# Patient Record
Sex: Female | Born: 1990
Health system: Southern US, Community
[De-identification: ages and names within clinical notes are randomized; demographics above are authoritative.]

## PROBLEM LIST (undated history)

## (undated) DIAGNOSIS — J309 Allergic rhinitis, unspecified: Secondary | ICD-10-CM

## (undated) DIAGNOSIS — N809 Endometriosis, unspecified: Secondary | ICD-10-CM

## (undated) DIAGNOSIS — D509 Iron deficiency anemia, unspecified: Secondary | ICD-10-CM

## (undated) DIAGNOSIS — D649 Anemia, unspecified: Secondary | ICD-10-CM

## (undated) DIAGNOSIS — Z9884 Bariatric surgery status: Secondary | ICD-10-CM

## (undated) DIAGNOSIS — Z8619 Personal history of other infectious and parasitic diseases: Secondary | ICD-10-CM

## (undated) DIAGNOSIS — R102 Pelvic and perineal pain: Secondary | ICD-10-CM

## (undated) DIAGNOSIS — E282 Polycystic ovarian syndrome: Secondary | ICD-10-CM

## (undated) DIAGNOSIS — E669 Obesity, unspecified: Secondary | ICD-10-CM

## (undated) DIAGNOSIS — Z973 Presence of spectacles and contact lenses: Secondary | ICD-10-CM

## (undated) DIAGNOSIS — Z87820 Personal history of traumatic brain injury: Secondary | ICD-10-CM

## (undated) HISTORY — DX: Anemia, unspecified: D64.9

## (undated) HISTORY — PX: LAPAROSCOPIC GASTRIC SLEEVE RESECTION: SHX5895

## (undated) HISTORY — PX: ESOPHAGOGASTRODUODENOSCOPY (EGD) WITH PROPOFOL: SHX5813

## (undated) HISTORY — DX: Obesity, unspecified: E66.9

## (undated) HISTORY — PX: WISDOM TOOTH EXTRACTION: SHX21

---

## 2013-08-05 ENCOUNTER — Encounter (HOSPITAL_BASED_OUTPATIENT_CLINIC_OR_DEPARTMENT_OTHER): Payer: Self-pay

## 2013-08-05 ENCOUNTER — Emergency Department (HOSPITAL_BASED_OUTPATIENT_CLINIC_OR_DEPARTMENT_OTHER)
Admission: EM | Admit: 2013-08-05 | Discharge: 2013-08-05 | Disposition: A | Discharge status: Home / Self Care | Payer: Self-pay | Attending: Emergency Medicine | Admitting: Emergency Medicine

## 2013-08-05 ENCOUNTER — Emergency Department (HOSPITAL_BASED_OUTPATIENT_CLINIC_OR_DEPARTMENT_OTHER): Payer: Self-pay

## 2013-08-05 DIAGNOSIS — R1013 Epigastric pain: Secondary | ICD-10-CM | POA: Insufficient documentation

## 2013-08-05 DIAGNOSIS — Z3202 Encounter for pregnancy test, result negative: Secondary | ICD-10-CM | POA: Insufficient documentation

## 2013-08-05 DIAGNOSIS — R1011 Right upper quadrant pain: Secondary | ICD-10-CM | POA: Insufficient documentation

## 2013-08-05 DIAGNOSIS — R112 Nausea with vomiting, unspecified: Secondary | ICD-10-CM | POA: Insufficient documentation

## 2013-08-05 DIAGNOSIS — Z8639 Personal history of other endocrine, nutritional and metabolic disease: Secondary | ICD-10-CM | POA: Insufficient documentation

## 2013-08-05 DIAGNOSIS — Z79899 Other long term (current) drug therapy: Secondary | ICD-10-CM | POA: Insufficient documentation

## 2013-08-05 DIAGNOSIS — R109 Unspecified abdominal pain: Secondary | ICD-10-CM

## 2013-08-05 DIAGNOSIS — Z862 Personal history of diseases of the blood and blood-forming organs and certain disorders involving the immune mechanism: Secondary | ICD-10-CM | POA: Insufficient documentation

## 2013-08-05 HISTORY — DX: Polycystic ovarian syndrome: E28.2

## 2013-08-05 LAB — COMPREHENSIVE METABOLIC PANEL
BUN: 13 mg/dL (ref 6–23)
Calcium: 10.2 mg/dL (ref 8.4–10.5)
GFR calc Af Amer: >90 mL/min (ref >90)
Glucose, Bld: 96 mg/dL (ref 70–99)
Total Protein: 7.8 g/dL (ref 6.0–8.3)

## 2013-08-05 LAB — CBC
HCT: 39.4 % (ref 36.0–46.0)
Hemoglobin: 13 g/dL (ref 12.0–15.0)
MCH: 26.8 pg (ref 26.0–34.0)
MCHC: 33 g/dL (ref 30.0–36.0)
RDW: 14.1 % (ref 11.5–15.5)

## 2013-08-05 LAB — URINALYSIS, ROUTINE W REFLEX MICROSCOPIC
Bilirubin Urine: NEGATIVE
Glucose, UA: NEGATIVE mg/dL
Ketones, ur: NEGATIVE mg/dL
Nitrite: POSITIVE — AB
Protein, ur: NEGATIVE mg/dL
pH: 7 (ref 5.0–8.0)

## 2013-08-05 LAB — URINE MICROSCOPIC-ADD ON

## 2013-08-05 LAB — PREGNANCY, URINE: Preg Test, Ur: NEGATIVE

## 2013-08-05 LAB — LIPASE, BLOOD: Lipase: 24 U/L (ref 11–59)

## 2013-08-05 MED ORDER — CEPHALEXIN 500 MG PO CAPS: 500.0000 mg | ORAL_CAPSULE | Freq: Four times a day (QID) | ORAL | Status: DC

## 2013-08-05 MED ORDER — ONDANSETRON HCL 4 MG/2ML IJ SOLN
4.0000 mg | Freq: Once | INTRAMUSCULAR | Status: AC
  Administered 2013-08-05: 4 mg via INTRAVENOUS
  Filled 2013-08-05: qty 8

## 2013-08-05 MED ORDER — ONDANSETRON HCL 4 MG/2ML IJ SOLN
4.0000 mg | Freq: Once | INTRAMUSCULAR | Status: AC
  Administered 2013-08-05: 4 mg via INTRAVENOUS
  Filled 2013-08-05: qty 2

## 2013-08-05 MED ORDER — DEXTROSE 5 % IV SOLN
1.0000 g | Freq: Once | INTRAVENOUS | Status: AC
  Administered 2013-08-05: 1 g via INTRAVENOUS
  Filled 2013-08-05: qty 10

## 2013-08-05 MED ORDER — FAMOTIDINE 20 MG PO TABS
20.0000 mg | ORAL_TABLET | Freq: Once | ORAL | Status: AC
  Administered 2013-08-05: 20 mg via ORAL
  Filled 2013-08-05: qty 1

## 2013-08-05 MED ORDER — IOHEXOL 300 MG/ML  SOLN
100.0000 mL | Freq: Once | INTRAMUSCULAR | Status: AC | PRN
  Administered 2013-08-05: 100 mL via INTRAVENOUS

## 2013-08-05 MED ORDER — HYDROCODONE-ACETAMINOPHEN 5-325 MG PO TABS: 1.0000 | ORAL_TABLET | ORAL | Status: DC | PRN

## 2013-08-05 MED ORDER — PROMETHAZINE HCL 25 MG PO TABS: 25.0000 mg | ORAL_TABLET | Freq: Four times a day (QID) | ORAL | Status: DC | PRN

## 2013-08-05 MED ORDER — IOHEXOL 300 MG/ML  SOLN
50.0000 mL | Freq: Once | INTRAMUSCULAR | Status: AC | PRN
  Administered 2013-08-05: 50 mL via ORAL

## 2013-08-05 MED ORDER — OMEPRAZOLE 20 MG PO CPDR: 20.0000 mg | DELAYED_RELEASE_CAPSULE | Freq: Every day | ORAL | Status: DC

## 2013-08-05 NOTE — ED Notes (Signed)
Pt started having abd pain x2days in upper rt quadrant. Reports h/o PCOS and thought symptoms were related until she vomited at 3pm today (9/5). Rates pain 9/10.

## 2013-08-05 NOTE — ED Notes (Signed)
Return from CT

## 2013-08-05 NOTE — ED Provider Notes (Signed)
CSN: [id number]     Arrival date & time 08/05/13  1819 History   First MD Initiated Contact with Patient 08/05/13 1833     Chief Complaint  Patient presents with  . Abdominal Pain  . Nausea   (Consider location/radiation/quality/duration/timing/severity/associated sxs/prior Treatment) The history is provided by the patient and medical records. No language interpreter was used.    Donna Marquez is a 22 y.o. female  with a hx of PCOS presents to the Emergency Department complaining of gradual, intermittent, progressively worsening epigastric and RUQ abd pain onset 2 days ago.  Pt states she has had pain like this before and it was always attributed to her PCOS but that pain was usually lower.  Pt had associated emesis approx 20 minutes after eating.  She had an increase in her pain at that time as well. Pain always occurs after eating and nothing makes it better including a heating pad.  Pt states pain resolves spontaneously several hours after eating.  Pt denies fever, chills, headache, neck pain, chest pain, SOB, diarrhea, weakness, dizziness, syncope, dysuria, hematuria.  Emesis is NBNB.     Past Medical History  Diagnosis Date  . PCOS (polycystic ovarian syndrome)    Past Surgical History  Procedure Laterality Date  . Wisdom tooth extraction     History reviewed. No pertinent family history. History  Substance Use Topics  . Smoking status: Never Smoker   . Smokeless tobacco: Never Used  . Alcohol Use: 1.2 oz/week    1 Shots of liquor, 1 Cans of beer per week     Comment: Per month   OB History   Grav Para Term Preterm Abortions TAB SAB Ect Mult Living                 Review of Systems  Constitutional: Negative for fever, diaphoresis, appetite change, fatigue and unexpected weight change.  HENT: Negative for mouth sores, trouble swallowing, neck pain and neck stiffness.   Respiratory: Negative for cough, chest tightness, shortness of breath, wheezing and stridor.     Cardiovascular: Negative for chest pain and palpitations.  Gastrointestinal: Positive for nausea, vomiting and abdominal pain. Negative for diarrhea, constipation, blood in stool, abdominal distention and rectal pain.  Genitourinary: Negative for dysuria, urgency, frequency, hematuria, flank pain and difficulty urinating.  Musculoskeletal: Negative for back pain.  Skin: Negative for rash.  Neurological: Negative for weakness.  Hematological: Negative for adenopathy.  Psychiatric/Behavioral: Negative for confusion.  All other systems reviewed and are negative.    Allergies  Review of patient's allergies indicates not on file.  Home Medications   Current Outpatient Rx  Name  Route  Sig  Dispense  Refill  . cephALEXin (KEFLEX) 500 MG capsule   Oral   Take 1 capsule (500 mg total) by mouth 4 (four) times daily.   40 capsule   0   . HYDROcodone-acetaminophen (NORCO/VICODIN) 5-325 MG per tablet   Oral   Take 1 tablet by mouth every 4 (four) hours as needed for pain.   6 tablet   0   . omeprazole (PRILOSEC) 20 MG capsule   Oral   Take 1 capsule (20 mg total) by mouth daily.   30 capsule   0   . promethazine (PHENERGAN) 25 MG tablet   Oral   Take 1 tablet (25 mg total) by mouth every 6 (six) hours as needed for nausea.   12 tablet   0    BP 110/67  Pulse 70  Temp(Src)  98.2 F (36.8 C) (Oral)  Resp 16  Ht 5\' 4"  (1.626 m)  Wt 250 lb (113.399 kg)  BMI 42.89 kg/m2  SpO2 98%  LMP 03/28/2013 Physical Exam  Nursing note and vitals reviewed. Constitutional: She is oriented to person, place, and time. She appears well-developed and well-nourished.  HENT:  Head: Normocephalic and atraumatic.  Mouth/Throat: Oropharynx is clear and moist.  Eyes: Conjunctivae are normal. No scleral icterus.  Neck: Normal range of motion.  Cardiovascular: Normal rate, regular rhythm, S1 normal, S2 normal, normal heart sounds and intact distal pulses.   No murmur heard. Pulses:      Radial  pulses are 2+ on the right side, and 2+ on the left side.       Dorsalis pedis pulses are 2+ on the right side, and 2+ on the left side.       Posterior tibial pulses are 2+ on the right side, and 2+ on the left side.  Pulmonary/Chest: Effort normal and breath sounds normal. She has no decreased breath sounds. She has no wheezes. She has no rhonchi. She has no rales. She exhibits no tenderness and no bony tenderness.  Abdominal: Soft. Normal appearance and bowel sounds are normal. She exhibits no distension and no mass. There is tenderness in the right upper quadrant and epigastric area. There is guarding. There is no rebound and no CVA tenderness.    Musculoskeletal: Normal range of motion. She exhibits no tenderness.  Lymphadenopathy:    She has no cervical adenopathy.  Neurological: She is alert and oriented to person, place, and time. She exhibits normal muscle tone. Coordination normal.  Skin: Skin is warm and dry. No erythema.  Psychiatric: She has a normal mood and affect. Her behavior is normal.    ED Course  Procedures (including critical care time) Labs Review Labs Reviewed  URINALYSIS, ROUTINE W REFLEX MICROSCOPIC - Abnormal; Notable for the following:    APPearance CLOUDY (*)    Nitrite POSITIVE (*)    Leukocytes, UA SMALL (*)    All other components within normal limits  URINE MICROSCOPIC-ADD ON - Abnormal; Notable for the following:    Squamous Epithelial / LPF FEW (*)    Bacteria, UA MANY (*)    All other components within normal limits  URINE CULTURE  PREGNANCY, URINE  CBC  COMPREHENSIVE METABOLIC PANEL  LIPASE, BLOOD   Imaging Review Ct Abdomen Pelvis W Contrast  08/05/2013   *RADIOLOGY REPORT*  Clinical Data: Right upper quadrant pain.  Polycystic ovary disease.  Vomiting  CT ABDOMEN AND PELVIS WITH CONTRAST  Technique:  Multidetector CT imaging of the abdomen and pelvis was performed following the standard protocol during bolus administration of intravenous  contrast.  Contrast: OMNIPAQUE IOHEXOL 300 MG/ML  SOLN, 50mL OMNIPAQUE IOHEXOL 300 MG/ML  SOLN  Comparison: None  Findings: Lung bases are clear.  No pericardial fluid.  No focal hepatic lesion.  The gallbladder, pancreas, spleen, adrenal glands, and kidneys are normal.  The stomach, small bowel, appendix,  and cecum are normal.   The colon and rectosigmoid colon are normal.  Abdominal aorta normal caliber.  No retroperitoneal periportal lymphadenopathy.  No free fluid the pelvis.  The ovaries and uterus appear normal. No pelvic lymphadenopathy. Review of  bone windows demonstrates no aggressive osseous lesions.  IMPRESSION: No acute abdominal or pelvic findings.   Original Report Authenticated By: Genevive Bi, M.D.    MDM   1. Abdominal pain   2. Nausea and vomiting  Donna Marquez presents with RUQ post prandial abd pain, nausea and vomiting.  Suspicion of gallbladder vs PUD.  Will obtain labs and imaging, will also give fluids and nausea control.    Patient is nontoxic, nonseptic appearing, in no apparent distress.  Patient's pain and other symptoms adequately managed in emergency department.  Fluid bolus given.  Labs, imaging and vitals reviewed.  Patient does not meet the SIRS or Sepsis criteria.  On repeat exam patient does not have a surgical abdomin and there are no peritoneal signs.  No indication of appendicitis, bowel obstruction, bowel perforation, cholecystitis, diverticulitis, PID or ectopic pregnancy.  Pt with urinary tract infection on urinalysis. Patient given Rocephin here in the emergency department. Patient tolerating by mouth here.  Will give Keflex, Phenergan for home. Patient also given PPI for treatment of possible peptic ulcer disease. Recommend GI followup if upper abdominal pain persists. Patient discharged home with symptomatic treatment and given strict instructions for follow-up with their primary care physician.  I have also discussed reasons to return  immediately to the ER.  Patient expresses understanding and agrees with plan.        Dahlia Client Brayen Bunn, PA-C 08/05/13 2236  Dierdre Forth, PA-C 08/05/13 2236

## 2013-08-06 NOTE — ED Provider Notes (Signed)
Medical screening examination/treatment/procedure(s) were performed by non-physician practitioner and as supervising physician I was immediately available for consultation/collaboration.    Celene Kras, MD 08/06/13 [phone]

## 2013-08-08 LAB — URINE CULTURE

## 2013-12-09 DIAGNOSIS — E282 Polycystic ovarian syndrome: Secondary | ICD-10-CM | POA: Insufficient documentation

## 2014-07-24 ENCOUNTER — Encounter (HOSPITAL_BASED_OUTPATIENT_CLINIC_OR_DEPARTMENT_OTHER): Payer: Self-pay | Admitting: Emergency Medicine

## 2014-07-24 ENCOUNTER — Emergency Department (HOSPITAL_BASED_OUTPATIENT_CLINIC_OR_DEPARTMENT_OTHER)
Admission: EM | Admit: 2014-07-24 | Discharge: 2014-07-24 | Disposition: A | Discharge status: Home / Self Care | Payer: Self-pay | Attending: Emergency Medicine | Admitting: Emergency Medicine

## 2014-07-24 DIAGNOSIS — J029 Acute pharyngitis, unspecified: Secondary | ICD-10-CM | POA: Insufficient documentation

## 2014-07-24 DIAGNOSIS — Z79899 Other long term (current) drug therapy: Secondary | ICD-10-CM | POA: Insufficient documentation

## 2014-07-24 DIAGNOSIS — Z792 Long term (current) use of antibiotics: Secondary | ICD-10-CM | POA: Insufficient documentation

## 2014-07-24 DIAGNOSIS — J039 Acute tonsillitis, unspecified: Secondary | ICD-10-CM | POA: Insufficient documentation

## 2014-07-24 DIAGNOSIS — Z8639 Personal history of other endocrine, nutritional and metabolic disease: Secondary | ICD-10-CM | POA: Insufficient documentation

## 2014-07-24 DIAGNOSIS — Z862 Personal history of diseases of the blood and blood-forming organs and certain disorders involving the immune mechanism: Secondary | ICD-10-CM | POA: Insufficient documentation

## 2014-07-24 DIAGNOSIS — Z8619 Personal history of other infectious and parasitic diseases: Secondary | ICD-10-CM | POA: Insufficient documentation

## 2014-07-24 LAB — RAPID STREP SCREEN (MED CTR MEBANE ONLY): Streptococcus, Group A Screen (Direct): NEGATIVE

## 2014-07-24 MED ORDER — DIPHENHYDRAMINE HCL 50 MG/ML IJ SOLN
50.0000 mg | Freq: Once | INTRAMUSCULAR | Status: AC
  Administered 2014-07-24: 50 mg via INTRAMUSCULAR
  Filled 2014-07-24: qty 1

## 2014-07-24 MED ORDER — AMOXICILLIN 500 MG PO CAPS: 500.0000 mg | ORAL_CAPSULE | Freq: Three times a day (TID) | ORAL | Status: AC

## 2014-07-24 NOTE — ED Provider Notes (Signed)
CSN: [id number]     Arrival date & time 07/24/14  1924 History   First MD Initiated Contact with Patient 07/24/14 2009     Chief Complaint  Patient presents with  . Sore Throat     (Consider location/radiation/quality/duration/timing/severity/associated sxs/prior Treatment) Patient is a 23 y.o. female presenting with pharyngitis. The history is provided by the patient. No language interpreter was used.  Sore Throat This is a new problem. The current episode started today. The problem occurs constantly. The problem has been gradually worsening. Associated symptoms include a sore throat. Nothing aggravates the symptoms. She has tried nothing for the symptoms. The treatment provided moderate relief.  Pt reports swelling to right tonsil for 3 weeks.  Pt reports today her tongue feels swollen.  Past Medical History  Diagnosis Date  . PCOS (polycystic ovarian syndrome)   . Mononucleosis    Past Surgical History  Procedure Laterality Date  . Wisdom tooth extraction     No family history on file. History  Substance Use Topics  . Smoking status: Never Smoker   . Smokeless tobacco: Never Used  . Alcohol Use: Yes   OB History   Grav Para Term Preterm Abortions TAB SAB Ect Mult Living                 Review of Systems  HENT: Positive for sore throat.   All other systems reviewed and are negative.     Allergies  Ciprofloxacin  Home Medications   Prior to Admission medications   Medication Sig Start Date End Date Taking? Authorizing Provider  amoxicillin (AMOXIL) 500 MG capsule Take 1 capsule (500 mg total) by mouth 3 (three) times daily. 07/24/14 08/03/14  Elson Areas, PA-C  cephALEXin (KEFLEX) 500 MG capsule Take 1 capsule (500 mg total) by mouth 4 (four) times daily. 08/05/13   Hannah Muthersbaugh, PA-C  HYDROcodone-acetaminophen (NORCO/VICODIN) 5-325 MG per tablet Take 1 tablet by mouth every 4 (four) hours as needed for pain. 08/05/13   Hannah Muthersbaugh, PA-C  omeprazole  (PRILOSEC) 20 MG capsule Take 1 capsule (20 mg total) by mouth daily. 08/05/13   Hannah Muthersbaugh, PA-C  promethazine (PHENERGAN) 25 MG tablet Take 1 tablet (25 mg total) by mouth every 6 (six) hours as needed for nausea. 08/05/13   Hannah Muthersbaugh, PA-C   BP 139/79  Pulse 94  Temp(Src) 98.1 F (36.7 C) (Oral)  Resp 22  Ht  (1.626 m)  Wt 240 lb (108.863 kg)  BMI 41.18 kg/m2  SpO2 100%  LMP 06/09/2014 Physical Exam  Nursing note and vitals reviewed. Constitutional: She is oriented to person, place, and time. She appears well-developed and well-nourished.  HENT:  Head: Normocephalic.  Right Ear: External ear normal.  Swollen right tonsil,   Tongue no obv edema  Eyes: EOM are normal. Pupils are equal, round, and reactive to light.  Neck: Normal range of motion.  Pulmonary/Chest: Effort normal.  Abdominal: She exhibits no distension.  Musculoskeletal: Normal range of motion.  Neurological: She is alert and oriented to person, place, and time.  Psychiatric: She has a normal mood and affect.    ED Course  Procedures (including critical care time) Labs Review Labs Reviewed  RAPID STREP SCREEN  CULTURE, GROUP A STREP    Imaging Review No results found.   EKG Interpretation None      MDM   Final diagnoses:  Tonsillitis    Pt reports relief with benadryl.   I will treat tonsillar swelling.  (amox  used due to cost)   Pt referred to Ent  Benadryl if sensation of swelling   Elson Areas, [place] 07/24/14 2153

## 2014-07-24 NOTE — ED Notes (Addendum)
C/o swelling to tonsils x 2-3 weeks-tongue swelling today-pt NAD-states s/s similar to when she had mono

## 2014-07-24 NOTE — ED Provider Notes (Signed)
History/physical exam/procedure(s) were performed by non-physician practitioner and as supervising physician I was immediately available for consultation/collaboration. I have reviewed all notes and am in agreement with care and plan.   Hilario Quarry, MD 07/24/14 [phone]

## 2014-07-24 NOTE — Discharge Instructions (Signed)

## 2014-07-26 LAB — CULTURE, GROUP A STREP

## 2014-11-26 ENCOUNTER — Emergency Department (HOSPITAL_BASED_OUTPATIENT_CLINIC_OR_DEPARTMENT_OTHER)
Admission: EM | Admit: 2014-11-26 | Discharge: 2014-11-26 | Disposition: A | Discharge status: Home / Self Care | Payer: Self-pay | Attending: Emergency Medicine | Admitting: Emergency Medicine

## 2014-11-26 ENCOUNTER — Encounter (HOSPITAL_BASED_OUTPATIENT_CLINIC_OR_DEPARTMENT_OTHER): Payer: Self-pay

## 2014-11-26 DIAGNOSIS — Z8639 Personal history of other endocrine, nutritional and metabolic disease: Secondary | ICD-10-CM | POA: Insufficient documentation

## 2014-11-26 DIAGNOSIS — F10120 Alcohol abuse with intoxication, uncomplicated: Secondary | ICD-10-CM

## 2014-11-26 DIAGNOSIS — R112 Nausea with vomiting, unspecified: Secondary | ICD-10-CM

## 2014-11-26 DIAGNOSIS — Z8619 Personal history of other infectious and parasitic diseases: Secondary | ICD-10-CM | POA: Insufficient documentation

## 2014-11-26 MED ORDER — KETOROLAC TROMETHAMINE 30 MG/ML IJ SOLN
30.0000 mg | Freq: Once | INTRAMUSCULAR | Status: AC
  Administered 2014-11-26: 30 mg via INTRAVENOUS
  Filled 2014-11-26: qty 1

## 2014-11-26 MED ORDER — SODIUM CHLORIDE 0.9 % IV BOLUS (SEPSIS)
1000.0000 mL | Freq: Once | INTRAVENOUS | Status: AC
Start: 2014-11-26 — End: 2014-11-26
  Administered 2014-11-26: 1000 mL via INTRAVENOUS

## 2014-11-26 MED ORDER — ONDANSETRON 4 MG PO TBDP: 4.0000 mg | ORAL_TABLET | Freq: Three times a day (TID) | ORAL | Status: DC | PRN

## 2014-11-26 MED ORDER — ONDANSETRON HCL 4 MG/2ML IJ SOLN
4.0000 mg | Freq: Once | INTRAMUSCULAR | Status: AC
  Administered 2014-11-26: 4 mg via INTRAVENOUS
  Filled 2014-11-26: qty 2

## 2014-11-26 NOTE — ED Provider Notes (Signed)
CSN: [id number]     Arrival date & time 11/26/14  1221 History   First MD Initiated Contact with Patient 11/26/14 1304     Chief Complaint  Patient presents with  . Emesis     (Consider location/radiation/quality/duration/timing/severity/associated sxs/prior Treatment) Patient is a 23 y.o. female presenting with vomiting. The history is provided by the patient and medical records.  Emesis   This is a 23 year old female with past medical history significant for PCOS, presenting to the ED for nausea and vomiting. Patient was out drinking at a bar last night where she had 4 shots of liquor and 3 mixed drinks and began vomiting at the bar. States she is continued having intermittent nausea and vomiting throughout the night. He has had no blood in her emesis.  States now she is having some abdominal cramping from repetitive vomiting. No diarrhea. No fever or chills. No prior abdominal surgeries.  VS stable on arrival.  Past Medical History  Diagnosis Date  . PCOS (polycystic ovarian syndrome)   . Mononucleosis    Past Surgical History  Procedure Laterality Date  . Wisdom tooth extraction     No family history on file. History  Substance Use Topics  . Smoking status: Never Smoker   . Smokeless tobacco: Never Used  . Alcohol Use: Yes   OB History    No data available     Review of Systems  Gastrointestinal: Positive for nausea and vomiting.  All other systems reviewed and are negative.     Allergies  Ciprofloxacin  Home Medications   Prior to Admission medications   Not on File   BP 130/69 mmHg  Pulse 93  Temp(Src) 97.7 F (36.5 C)  Resp 18  SpO2 100%   Physical Exam  Constitutional: She is oriented to person, place, and time. She appears well-developed and well-nourished. No distress.  HENT:  Head: Normocephalic and atraumatic.  Mouth/Throat: Oropharynx is clear and moist.  Mildly dry mucous membranes  Eyes: Conjunctivae and EOM are normal. Pupils are equal,  round, and reactive to light.  Neck: Normal range of motion. Neck supple.  Cardiovascular: Normal rate, regular rhythm and normal heart sounds.   Pulmonary/Chest: Effort normal and breath sounds normal. No respiratory distress. She has no wheezes.  Abdominal: Soft. Bowel sounds are normal. There is no tenderness. There is no guarding.  Musculoskeletal: Normal range of motion. She exhibits no edema.  Neurological: She is alert and oriented to person, place, and time.  Skin: Skin is warm and dry. She is not diaphoretic.  Psychiatric: She has a normal mood and affect.  Nursing note and vitals reviewed.   ED Course  Procedures (including critical care time) Labs Review Labs Reviewed - No data to display  Imaging Review No results found.   EKG Interpretation None      MDM   Final diagnoses:  Nausea and vomiting, vomiting of unspecified type  Hangover effect, uncomplicated   23 year old female with nausea and vomiting after drinking alcohol last night.  On exam, patient afebrile and in no acute distress. Her abdominal exam is benign. Her mucous membranes are mildly dry, but she does not appear overly dehydrated. Do not feel lab work indicated at this time. Patient given IV fluids and Zofran. Will reassess.  After fluids and Zofran patient feeling much better. She was able to tolerate ginger ale and water without difficulty, no recurrent nausea or vomiting. Abdominal exam remains benign.Patient will be discharged home with supportive care and zofran.  Discussed plan with patient, he/she acknowledged understanding and agreed with plan of care.  Return precautions given for new or worsening symptoms.  Garlon HatchetLisa M Inaara Tye, PA-C 11/26/14 1622  Gilda Creasehristopher J. Pollina, MD 11/28/14 [phone]

## 2014-11-26 NOTE — Discharge Instructions (Signed)
Take the prescribed medication as directed.  Continue to drink plenty of fluids to stay hydrated.  Start with bland foods and progress back to normal diet as tolerated. Return to the ED for new or worsening symptoms.

## 2014-11-26 NOTE — ED Notes (Signed)
Pt drank half a can of ginger ale and some water and tolerated it with no nausea or vomiting. Danna HeftyGolden, Aviva Wolfer Lee, RN

## 2014-11-26 NOTE — ED Notes (Signed)
Patient here with vomiting that started last pm while drinking at bar, multiple episodes of same throughout the night

## 2015-04-26 ENCOUNTER — Encounter (HOSPITAL_BASED_OUTPATIENT_CLINIC_OR_DEPARTMENT_OTHER): Payer: Self-pay | Admitting: *Deleted

## 2015-04-26 ENCOUNTER — Emergency Department (HOSPITAL_BASED_OUTPATIENT_CLINIC_OR_DEPARTMENT_OTHER): Payer: BLUE CROSS/BLUE SHIELD

## 2015-04-26 ENCOUNTER — Emergency Department (HOSPITAL_BASED_OUTPATIENT_CLINIC_OR_DEPARTMENT_OTHER)
Admission: EM | Admit: 2015-04-26 | Discharge: 2015-04-26 | Disposition: A | Discharge status: Home / Self Care | Payer: BLUE CROSS/BLUE SHIELD | Attending: Emergency Medicine | Admitting: Emergency Medicine

## 2015-04-26 DIAGNOSIS — R0789 Other chest pain: Secondary | ICD-10-CM

## 2015-04-26 DIAGNOSIS — R079 Chest pain, unspecified: Secondary | ICD-10-CM | POA: Diagnosis present

## 2015-04-26 DIAGNOSIS — Z8639 Personal history of other endocrine, nutritional and metabolic disease: Secondary | ICD-10-CM | POA: Insufficient documentation

## 2015-04-26 DIAGNOSIS — Z8619 Personal history of other infectious and parasitic diseases: Secondary | ICD-10-CM | POA: Insufficient documentation

## 2015-04-26 LAB — CBC WITH DIFFERENTIAL/PLATELET
BASOS ABS: 0 10*3/uL (ref 0.0–0.1)
BASOS PCT: 1 % (ref 0–1)
EOS PCT: 1 % (ref 0–5)
Eosinophils Absolute: 0.1 10*3/uL (ref 0.0–0.7)
HCT: 38.1 % (ref 36.0–46.0)
HEMOGLOBIN: 12.4 g/dL (ref 12.0–15.0)
LYMPHS PCT: 31 % (ref 12–46)
Lymphs Abs: 2.3 10*3/uL (ref 0.7–4.0)
MCH: 26.7 pg (ref 26.0–34.0)
MCHC: 32.5 g/dL (ref 30.0–36.0)
MCV: 82.1 fL (ref 78.0–100.0)
MONOS PCT: 9 % (ref 3–12)
Monocytes Absolute: 0.7 10*3/uL (ref 0.1–1.0)
NEUTROS PCT: 58 % (ref 43–77)
Neutro Abs: 4.4 10*3/uL (ref 1.7–7.7)
PLATELETS: 353 10*3/uL (ref 150–400)
RBC: 4.64 MIL/uL (ref 3.87–5.11)
RDW: 13.5 % (ref 11.5–15.5)
WBC: 7.5 10*3/uL (ref 4.0–10.5)

## 2015-04-26 LAB — BASIC METABOLIC PANEL
Anion gap: 6 (ref 5–15)
BUN: 8 mg/dL (ref 6–20)
CO2: 27 mmol/L (ref 22–32)
CREATININE: 0.61 mg/dL (ref 0.44–1.00)
Calcium: 9.1 mg/dL (ref 8.9–10.3)
Chloride: 103 mmol/L (ref 101–111)
Glucose, Bld: 104 mg/dL — ABNORMAL HIGH (ref 65–99)
Potassium: 4.2 mmol/L (ref 3.5–5.1)
Sodium: 136 mmol/L (ref 135–145)

## 2015-04-26 LAB — TROPONIN I

## 2015-04-26 NOTE — ED Notes (Signed)
Patient transported to X-ray 

## 2015-04-26 NOTE — ED Notes (Signed)
Pt reports hx of anxiety- reports early this morning began having chest pressure on left side-

## 2015-04-26 NOTE — Discharge Instructions (Signed)
Chest Pain (Nonspecific) °There is no evidence of heart attack or blood clot in the lung. Follow up with your doctor. Return to the ED if you develop new or worsening symptoms. °It is often hard to give a specific diagnosis for the cause of chest pain. There is always a chance that your pain could be related to something serious, such as a heart attack or a blood clot in the lungs. You need to follow up with your health care provider for further evaluation. °CAUSES  °· Heartburn. °· Pneumonia or bronchitis. °· Anxiety or stress. °· Inflammation around your heart (pericarditis) or lung (pleuritis or pleurisy). °· A blood clot in the lung. °· A collapsed lung (pneumothorax). It can develop suddenly on its own (spontaneous pneumothorax) or from trauma to the chest. °· Shingles infection (herpes zoster virus). °The chest wall is composed of bones, muscles, and cartilage. Any of these can be the source of the pain. °· The bones can be bruised by injury. °· The muscles or cartilage can be strained by coughing or overwork. °· The cartilage can be affected by inflammation and become sore (costochondritis). °DIAGNOSIS  °Lab tests or other studies may be needed to find the cause of your pain. Your health care provider may have you take a test called an ambulatory electrocardiogram (ECG). An ECG records your heartbeat patterns over a 24-hour period. You may also have other tests, such as: °· Transthoracic echocardiogram (TTE). During echocardiography, sound waves are used to evaluate how blood flows through your heart. °· Transesophageal echocardiogram (TEE). °· Cardiac monitoring. This allows your health care provider to monitor your heart rate and rhythm in real time. °· Holter monitor. This is a portable device that records your heartbeat and can help diagnose heart arrhythmias. It allows your health care provider to track your heart activity for several days, if needed. °· Stress tests by exercise or by giving medicine  that makes the heart beat faster. °TREATMENT  °· Treatment depends on what may be causing your chest pain. Treatment may include: °¨ Acid blockers for heartburn. °¨ Anti-inflammatory medicine. °¨ Pain medicine for inflammatory conditions. °¨ Antibiotics if an infection is present. °· You may be advised to change lifestyle habits. This includes stopping smoking and avoiding alcohol, caffeine, and chocolate. °· You may be advised to keep your head raised (elevated) when sleeping. This reduces the chance of acid going backward from your stomach into your esophagus. °Most of the time, nonspecific chest pain will improve within 2-3 days with rest and mild pain medicine.  °HOME CARE INSTRUCTIONS  °· If antibiotics were prescribed, take them as directed. Finish them even if you start to feel better. °· For the next few days, avoid physical activities that bring on chest pain. Continue physical activities as directed. °· Do not use any tobacco products, including cigarettes, chewing tobacco, or electronic cigarettes. °· Avoid drinking alcohol. °· Only take medicine as directed by your health care provider. °· Follow your health care provider's suggestions for further testing if your chest pain does not go away. °· Keep any follow-up appointments you made. If you do not go to an appointment, you could develop lasting (chronic) problems with pain. If there is any problem keeping an appointment, call to reschedule. °SEEK MEDICAL CARE IF:  °· Your chest pain does not go away, even after treatment. °· You have a rash with blisters on your chest. °· You have a fever. °SEEK IMMEDIATE MEDICAL CARE IF:  °· You have increased   chest pain or pain that spreads to your arm, neck, jaw, back, or abdomen. °· You have shortness of breath. °· You have an increasing cough, or you cough up blood. °· You have severe back or abdominal pain. °· You feel nauseous or vomit. °· You have severe weakness. °· You faint. °· You have chills. °This is an  emergency. Do not wait to see if the pain will go away. Get medical help at once. Call your local emergency services (911 in [place] Do not drive yourself to the hospital. °MAKE SURE YOU:  °· Understand these instructions. °· Will watch your condition. °· Will get help right away if you are not doing well or get worse. °Document Released: 08/27/2005 Document Revised: 11/22/2013 Document Reviewed: 06/22/2008 °[employer]® Patient Information ©2015 [employer]. This information is not intended to replace advice given to you by your health care provider. Make sure you discuss any questions you have with your health care provider. ° °

## 2015-04-26 NOTE — ED Notes (Signed)
Family at bedside. 

## 2015-04-26 NOTE — ED Notes (Signed)
AT time of d/c pt's friend/family voiced dissatisfaction that cause for chest pain was not given by EDP- explained that ED was not equipped to do extensive testing but to r/o emergency situations and have pt f/u with PCP for further evaluation. Pt voiced that she had set up MD appt for this after- reviewed emergency symptoms that she would need to return to ED

## 2015-04-26 NOTE — ED Provider Notes (Signed)
CSN: [id number]     Arrival date & time 04/26/15  1101 History  This chart was scribed for Donna Octave, MD by Donna Marquez, ED Scribe. This patient was seen in room MH09/MH09 and the patient's care was started 11:34 AM.    Chief Complaint  Patient presents with  . Chest Pain   The history is provided by the patient. No language interpreter was used.     HPI Comments: Donna Marquez is a 24 y.o. female who presents to the Emergency Department complaining of 1 day of constant chest pressure with intermittent episodes of left sided CP lasting 1 min occurring a few times each hour. She denies any modifying factors. She reports 1 episode of associated SOB yesterday afternoon. She states she has not been sleeping well for the last few days. She has taken Tylenol without significant relief. She denies cough, fever. She denies recent long distance travel.   Past Medical History  Diagnosis Date  . PCOS (polycystic ovarian syndrome)   . Mononucleosis    Past Surgical History  Procedure Laterality Date  . Wisdom tooth extraction     No family history on file. History  Substance Use Topics  . Smoking status: Never Smoker   . Smokeless tobacco: Never Used  . Alcohol Use: Yes     Comment: 2-3 wine/weekly   OB History    No data available     Review of Systems  A complete 10 system review of systems was obtained and all systems are negative except as noted in the HPI and PMH.    Allergies  Ciprofloxacin  Home Medications   Prior to Admission medications   Medication Sig Start Date End Date Taking? Authorizing Provider  ondansetron (ZOFRAN ODT) 4 MG disintegrating tablet Take 1 tablet (4 mg total) by mouth every 8 (eight) hours as needed for nausea. 11/26/14   Garlon Hatchet, PA-C   BP 121/66 mmHg  Pulse 77  Temp(Src) 98.1 F (36.7 C) (Oral)  Resp 18  Ht  (1.651 m)  Wt 270 lb (122.471 kg)  BMI 44.93 kg/m2  SpO2 98%  LMP 04/19/2015 Physical Exam  Constitutional: She  is oriented to person, place, and time. She appears well-developed and well-nourished. No distress.  HENT:  Head: Normocephalic and atraumatic.  Mouth/Throat: Oropharynx is clear and moist. No oropharyngeal exudate.  Eyes: Conjunctivae and EOM are normal. Pupils are equal, round, and reactive to light.  Neck: Normal range of motion. Neck supple.  No meningismus.  Cardiovascular: Normal rate, regular rhythm, normal heart sounds and intact distal pulses.   No murmur heard. Pulmonary/Chest: Effort normal and breath sounds normal. No respiratory distress. She exhibits no tenderness. Left breast exhibits no mass and no nipple discharge.  Left sided chest wall tenderness. Left breast appears normal. No appreciable masses. No nipple discharge.  Chaperone present.   Abdominal: Soft. There is no tenderness. There is no rebound and no guarding.  Musculoskeletal: Normal range of motion. She exhibits no edema or tenderness.  Neurological: She is alert and oriented to person, place, and time. No cranial nerve deficit. She exhibits normal muscle tone. Coordination normal.  No ataxia on finger to nose bilaterally. No pronator drift. 5/5 strength throughout. CN 2-12 intact. Negative Romberg. Equal grip strength. Sensation intact. Gait is normal.   Skin: Skin is warm.  Psychiatric: She has a normal mood and affect. Her behavior is normal.  Nursing note and vitals reviewed.   ED Course  Procedures (including critical care  time)  DIAGNOSTIC STUDIES: Oxygen Saturation is 98% on RA, normal by my interpretation.    COORDINATION OF CARE: 11:44 AM Will order CXR and labs. Discussed treatment plan with pt at bedside and pt agreed to plan.   Labs Review Labs Reviewed  BASIC METABOLIC PANEL - Abnormal; Notable for the following:    Glucose, Bld 104 (*)    All other components within normal limits  CBC WITH DIFFERENTIAL/PLATELET  TROPONIN I    Imaging Review Dg Chest 2 View  04/26/2015   CLINICAL DATA:   Left chest pain for 1 day.  EXAM: CHEST  2 VIEW  COMPARISON:  None.  FINDINGS: The cardiomediastinal silhouette is unremarkable.  There is no evidence of focal airspace disease, pulmonary edema, suspicious pulmonary nodule/mass, pleural effusion, or pneumothorax. No acute bony abnormalities are identified.  IMPRESSION: No active cardiopulmonary disease.   Electronically Signed   By: Harmon PierJeffrey  Hu M.D.   On: 04/26/2015 12:15     EKG Interpretation   Date/Time:  Thursday Apr 26 2015 11:11:31 EDT Ventricular Rate:  87 PR Interval:  148 QRS Duration: 90 QT Interval:  338 QTC Calculation: 406 R Axis:   50 Text Interpretation:  Normal sinus rhythm Normal ECG No previous ECGs  available Confirmed by Jameil Whitmoyer  MD, Yoana Staib ([id number]) on 04/26/2015 11:42:56  AM      MDM   Final diagnoses:  Atypical chest pain   Discomfort in left chest since yesterday that is constant pressure. Intermittent sharp pains that come and go lasting a few seconds at a time. Denies shortness of breath, cough or fever. Chest discomfort is worse with arm movement.  EKG nsr.  PERC negative.  Chest x-ray negative. Labs normal.  patient reassured that pain is atypical for ACS or PE. She is PERC negative.  Some component of muscle skeletal pain on exam as it is reproduced with palpation of chest wall. Follow up with PCP. Return precautions discussed.   I personally performed the services described in this documentation, which was scribed in my presence. The recorded information has been reviewed and is accurate.   Donna OctaveStephen Ulice Follett, MD 04/26/15 1308

## 2018-08-03 ENCOUNTER — Emergency Department (HOSPITAL_BASED_OUTPATIENT_CLINIC_OR_DEPARTMENT_OTHER): Payer: BLUE CROSS/BLUE SHIELD

## 2018-08-03 ENCOUNTER — Emergency Department (HOSPITAL_BASED_OUTPATIENT_CLINIC_OR_DEPARTMENT_OTHER)
Admission: EM | Admit: 2018-08-03 | Discharge: 2018-08-04 | Disposition: A | Discharge status: Home / Self Care | Payer: BLUE CROSS/BLUE SHIELD | Attending: Emergency Medicine | Admitting: Emergency Medicine

## 2018-08-03 ENCOUNTER — Encounter (HOSPITAL_BASED_OUTPATIENT_CLINIC_OR_DEPARTMENT_OTHER): Payer: Self-pay

## 2018-08-03 ENCOUNTER — Other Ambulatory Visit: Payer: Self-pay

## 2018-08-03 DIAGNOSIS — R1032 Left lower quadrant pain: Secondary | ICD-10-CM | POA: Diagnosis not present

## 2018-08-03 DIAGNOSIS — R1030 Lower abdominal pain, unspecified: Secondary | ICD-10-CM

## 2018-08-03 DIAGNOSIS — R109 Unspecified abdominal pain: Secondary | ICD-10-CM | POA: Diagnosis not present

## 2018-08-03 DIAGNOSIS — R1031 Right lower quadrant pain: Secondary | ICD-10-CM | POA: Diagnosis not present

## 2018-08-03 DIAGNOSIS — R198 Other specified symptoms and signs involving the digestive system and abdomen: Secondary | ICD-10-CM | POA: Diagnosis not present

## 2018-08-03 LAB — COMPREHENSIVE METABOLIC PANEL
ALK PHOS: 79 U/L (ref 38–126)
ALT: 12 U/L (ref 0–44)
ANION GAP: 10 (ref 5–15)
AST: 19 U/L (ref 15–41)
Albumin: 4.3 g/dL (ref 3.5–5.0)
BUN: 12 mg/dL (ref 6–20)
CALCIUM: 8.8 mg/dL — AB (ref 8.9–10.3)
CO2: 26 mmol/L (ref 22–32)
Chloride: 102 mmol/L (ref 98–111)
Creatinine, Ser: 0.58 mg/dL (ref 0.44–1.00)
GFR calc Af Amer: >60 mL/min (ref >60)
GFR calc non Af Amer: >60 mL/min (ref >60)
Glucose, Bld: 89 mg/dL (ref 70–99)
Potassium: 3.8 mmol/L (ref 3.5–5.1)
SODIUM: 138 mmol/L (ref 135–145)
Total Bilirubin: 0.3 mg/dL (ref 0.3–1.2)
Total Protein: 7.7 g/dL (ref 6.5–8.1)

## 2018-08-03 LAB — CBC
HCT: 38.8 % (ref 36.0–46.0)
HEMOGLOBIN: 12.7 g/dL (ref 12.0–15.0)
MCH: 27.7 pg (ref 26.0–34.0)
MCHC: 32.7 g/dL (ref 30.0–36.0)
MCV: 84.7 fL (ref 78.0–100.0)
Platelets: 329 10*3/uL (ref 150–400)
RBC: 4.58 MIL/uL (ref 3.87–5.11)
RDW: 13.8 % (ref 11.5–15.5)
WBC: 9.3 10*3/uL (ref 4.0–10.5)

## 2018-08-03 LAB — URINALYSIS, ROUTINE W REFLEX MICROSCOPIC
Bilirubin Urine: NEGATIVE
GLUCOSE, UA: NEGATIVE mg/dL
Ketones, ur: 15 mg/dL — AB
Nitrite: NEGATIVE
PH: 5.5 (ref 5.0–8.0)
PROTEIN: NEGATIVE mg/dL
Specific Gravity, Urine: >1.03 — ABNORMAL HIGH (ref 1.005–1.030)

## 2018-08-03 LAB — URINALYSIS, MICROSCOPIC (REFLEX)

## 2018-08-03 LAB — PREGNANCY, URINE: Preg Test, Ur: NEGATIVE

## 2018-08-03 LAB — LIPASE, BLOOD: LIPASE: 33 U/L (ref 11–51)

## 2018-08-03 MED ORDER — IOPAMIDOL (ISOVUE-300) INJECTION 61%
100.0000 mL | Freq: Once | INTRAVENOUS | Status: AC | PRN
  Administered 2018-08-03: 100 mL via INTRAVENOUS

## 2018-08-03 MED ORDER — SODIUM CHLORIDE 0.9 % IV BOLUS
1000.0000 mL | Freq: Once | INTRAVENOUS | Status: AC
  Administered 2018-08-03: 1000 mL via INTRAVENOUS

## 2018-08-03 MED ORDER — HYDROMORPHONE HCL 1 MG/ML IJ SOLN
0.5000 mg | Freq: Once | INTRAMUSCULAR | Status: AC
  Administered 2018-08-03: 0.5 mg via INTRAVENOUS
  Filled 2018-08-03: qty 1

## 2018-08-03 MED ORDER — SODIUM CHLORIDE 0.9 % IV SOLN: INTRAVENOUS | Status: DC

## 2018-08-03 MED ORDER — ONDANSETRON HCL 4 MG/2ML IJ SOLN
4.0000 mg | Freq: Once | INTRAMUSCULAR | Status: AC
  Administered 2018-08-03: 4 mg via INTRAVENOUS
  Filled 2018-08-03: qty 2

## 2018-08-03 NOTE — ED Triage Notes (Signed)
C/o RLQ pain day-was seen and sent by UC for r/o appendicitis-NAD-steady gait

## 2018-08-03 NOTE — ED Notes (Signed)
Pt drinking oral contrast.

## 2018-08-03 NOTE — ED Notes (Signed)
Patient transported to CT 

## 2018-08-03 NOTE — ED Notes (Signed)
ED Provider at bedside. 

## 2018-08-04 MED ORDER — HYDROCODONE-ACETAMINOPHEN 5-325 MG PO TABS: 1.0000 | ORAL_TABLET | Freq: Four times a day (QID) | ORAL | 0 refills | Status: DC | PRN

## 2018-08-04 NOTE — Discharge Instructions (Signed)
Take the hydrocodone as needed.  If symptoms do not resolve would recommend follow-up with GYN.  Return for any new or worse symptoms.  Today's work-up including labs and CT scan of the abdomen without any acute findings.

## 2018-08-04 NOTE — ED Provider Notes (Signed)
[facility] EMERGENCY DEPARTMENT Provider Note   CSN: [id number] Arrival date & time: 08/03/18  1835     History   Chief Complaint Chief Complaint  Patient presents with  . Abdominal Pain    HPI Donna Marquez is a 27 y.o. female.  Patient sent in from urgent care for concerns for appendicitis.  Patient with a complaint of bilateral lower quadrant abdominal pain right side worse than left starting yesterday evening at about 8 PM.  No dysuria no vaginal discharge.  Associated with some nausea no vomiting no fevers.  No back pain.  Patient has a history significant for a sleeve gastrectomy.  Patient was seen at [facility] urgent [facility] and referred here for further evaluation.     Past Medical History:  Diagnosis Date  . Mononucleosis   . PCOS (polycystic ovarian syndrome)     There are no active problems to display for this patient.   Past Surgical History:  Procedure Laterality Date  . LAPAROSCOPIC GASTRIC SLEEVE RESECTION    . WISDOM TOOTH EXTRACTION       OB History   None      Home Medications    Prior to Admission medications   Medication Sig Start Date End Date Taking? Authorizing Provider  HYDROcodone-acetaminophen (NORCO/VICODIN) 5-325 MG tablet Take 1 tablet by mouth every 6 (six) hours as needed for moderate pain. 08/04/18   Vanetta Mulders, MD  ondansetron (ZOFRAN ODT) 4 MG disintegrating tablet Take 1 tablet (4 mg total) by mouth every 8 (eight) hours as needed for nausea. 11/26/14   Garlon Hatchet, PA-C    Family History No family history on file.  Social History Social History   Tobacco Use  . Smoking status: Never Smoker  . Smokeless tobacco: Never Used  Substance Use Topics  . Alcohol use: Yes    Comment: occ  . Drug use: No     Allergies   Ciprofloxacin   Review of Systems Review of Systems  Constitutional: Negative for fever.  HENT: Negative for congestion.   Eyes: Negative for redness.  Respiratory: Negative for  shortness of breath.   Cardiovascular: Negative for chest pain.  Gastrointestinal: Positive for abdominal pain, diarrhea and nausea. Negative for vomiting.  Genitourinary: Negative for dysuria, vaginal bleeding and vaginal discharge.  Musculoskeletal: Negative for back pain.  Skin: Negative for rash.  Neurological: Negative for headaches.  Hematological: Does not bruise/bleed easily.  Psychiatric/Behavioral: Negative for confusion.     Physical Exam Updated Vital Signs BP 112/74 (BP Location: Right Arm)   Pulse 74   Temp 98 F (36.7 C) (Oral)   Resp 18   Ht 1.676 m (5\' 6" )   Wt 102.1 kg   LMP  (LMP Unknown)   SpO2 98%   BMI 36.32 kg/m   Physical Exam  Constitutional: She is oriented to person, place, and time. She appears well-developed and well-nourished. No distress.  HENT:  Head: Normocephalic and atraumatic.  Eyes: Pupils are equal, round, and reactive to light. Conjunctivae and EOM are normal.  Neck: Neck supple.  Cardiovascular: Normal rate, regular rhythm and normal heart sounds.  Pulmonary/Chest: Effort normal and breath sounds normal. No respiratory distress.  Abdominal: Soft. Bowel sounds are normal. There is tenderness. There is no guarding.  Musculoskeletal: Normal range of motion.  Neurological: She is alert and oriented to person, place, and time. No cranial nerve deficit or sensory deficit. She exhibits normal muscle tone. Coordination normal.  Skin: Skin is warm. No rash noted.  Nursing note and vitals reviewed.    ED Treatments / Results  Labs (all labs ordered are listed, but only abnormal results are displayed) Labs Reviewed  URINALYSIS, ROUTINE W REFLEX MICROSCOPIC - Abnormal; Notable for the following components:      Result Value   APPearance CLOUDY (*)    Specific Gravity, Urine >1.030 (*)    Hgb urine dipstick TRACE (*)    Ketones, ur 15 (*)    Leukocytes, UA SMALL (*)    All other components within normal limits  COMPREHENSIVE METABOLIC  PANEL - Abnormal; Notable for the following components:   Calcium 8.8 (*)    All other components within normal limits  URINALYSIS, MICROSCOPIC (REFLEX) - Abnormal; Notable for the following components:   Bacteria, UA FEW (*)    All other components within normal limits  PREGNANCY, URINE  LIPASE, BLOOD  CBC    EKG None  Radiology Ct Abdomen Pelvis W Contrast  Result Date: 08/03/2018 CLINICAL DATA:  Right lower quadrant abdominal pain. EXAM: CT ABDOMEN AND PELVIS WITH CONTRAST TECHNIQUE: Multidetector CT imaging of the abdomen and pelvis was performed using the standard protocol following bolus administration of intravenous contrast. CONTRAST:  ISOVUE-300 IOPAMIDOL (ISOVUE-300) INJECTION 61% COMPARISON:  08/05/2013 FINDINGS: LOWER CHEST: No basilar pulmonary nodules or pleural effusion. No apical pericardial effusion. HEPATOBILIARY: Normal hepatic contours and density. No intra- or extrahepatic biliary dilatation. Normal gallbladder. PANCREAS: Normal parenchymal contours without ductal dilatation. No peripancreatic fluid collection. SPLEEN: Normal. ADRENALS/URINARY TRACT: --Adrenal glands: Normal. --Right kidney/ureter: No hydronephrosis, nephroureterolithiasis, perinephric stranding or solid renal mass. --Left kidney/ureter: No hydronephrosis, nephroureterolithiasis, perinephric stranding or solid renal mass. --Urinary bladder: Normal for degree of distention STOMACH/BOWEL: --Stomach/Duodenum: Status post gastric sleeve resection. Normal duodenal course. --Small bowel: No dilatation or inflammation. --Colon: No focal abnormality. --Appendix: Normal. VASCULAR/LYMPHATIC: Normal course and caliber of the major abdominal vessels. No abdominal or pelvic lymphadenopathy. REPRODUCTIVE: Normal uterus and ovaries. Contraceptive device within the uterus. MUSCULOSKELETAL. No bony spinal canal stenosis or focal osseous abnormality. OTHER: None. IMPRESSION: Normal appendix.  No acute abnormality of the  abdomen or pelvis. Electronically Signed   By: Deatra Robinson M.D.   On: 08/03/2018 22:58    Procedures Procedures (including critical care time)  Medications Ordered in ED Medications  0.9 %  sodium chloride infusion (has no administration in time range)  sodium chloride 0.9 % bolus 1,000 mL (0 mLs Intravenous Stopped 08/04/18 0011)  ondansetron (ZOFRAN) injection 4 mg (4 mg Intravenous Given 08/03/18 2053)  HYDROmorphone (DILAUDID) injection 0.5 mg (0.5 mg Intravenous Given 08/03/18 2053)  HYDROmorphone (DILAUDID) injection 0.5 mg (0.5 mg Intravenous Given 08/03/18 2202)  iopamidol (ISOVUE-300) 61 % injection 100 mL (100 mLs Intravenous Contrast Given 08/03/18 2227)     Initial Impression / Assessment and Plan / ED Course  I have reviewed the triage vital signs and the nursing notes.  Pertinent labs & imaging results that were available during my care of the patient were reviewed by me and considered in my medical decision making (see chart for details).    CT scan of the abdomen without evidence of any acute abdominal process.  Labs without any significant abnormality.  Urinalysis not consistent with urinary tract infection.  Pregnancy test negative.  Recommend follow-up with urgent care as well as back to GYN for evaluation and possible concern for endometriosis which she is been told that she has had in the past.  Patient has a history of polycystic ovarian nothing at significant abnormal on  the CAT scan.  Patient without any vaginal discharge or vaginal bleeding.  Patient denies any concerns for pelvic infection.   Final Clinical Impressions(s) / ED Diagnoses   Final diagnoses:  Lower abdominal pain    ED Discharge Orders         Ordered    HYDROcodone-acetaminophen (NORCO/VICODIN) 5-325 MG tablet  Every 6 hours PRN     08/04/18 0009           Vanetta Mulders, MD 08/04/18 [phone]

## 2018-09-13 DIAGNOSIS — E282 Polycystic ovarian syndrome: Secondary | ICD-10-CM | POA: Diagnosis not present

## 2018-09-13 DIAGNOSIS — R102 Pelvic and perineal pain: Secondary | ICD-10-CM | POA: Diagnosis not present

## 2018-09-13 DIAGNOSIS — N946 Dysmenorrhea, unspecified: Secondary | ICD-10-CM | POA: Diagnosis not present

## 2018-09-13 DIAGNOSIS — Z202 Contact with and (suspected) exposure to infections with a predominantly sexual mode of transmission: Secondary | ICD-10-CM | POA: Diagnosis not present

## 2018-09-13 DIAGNOSIS — Z113 Encounter for screening for infections with a predominantly sexual mode of transmission: Secondary | ICD-10-CM | POA: Diagnosis not present

## 2018-09-28 DIAGNOSIS — R102 Pelvic and perineal pain: Secondary | ICD-10-CM | POA: Diagnosis not present

## 2018-10-14 ENCOUNTER — Encounter (HOSPITAL_BASED_OUTPATIENT_CLINIC_OR_DEPARTMENT_OTHER): Payer: Self-pay

## 2018-10-14 ENCOUNTER — Ambulatory Visit (HOSPITAL_BASED_OUTPATIENT_CLINIC_OR_DEPARTMENT_OTHER): Admit: 2018-10-14 | Payer: BLUE CROSS/BLUE SHIELD | Admitting: Obstetrics and Gynecology

## 2018-10-14 SURGERY — EXCISION, CYST, OVARY, LAPAROSCOPIC
Anesthesia: Choice

## 2018-11-15 DIAGNOSIS — Z3040 Encounter for surveillance of contraceptives, unspecified: Secondary | ICD-10-CM | POA: Diagnosis not present

## 2018-11-15 DIAGNOSIS — E282 Polycystic ovarian syndrome: Secondary | ICD-10-CM | POA: Diagnosis not present

## 2018-11-15 DIAGNOSIS — Z30432 Encounter for removal of intrauterine contraceptive device: Secondary | ICD-10-CM | POA: Diagnosis not present

## 2018-11-15 DIAGNOSIS — R102 Pelvic and perineal pain: Secondary | ICD-10-CM | POA: Diagnosis not present

## 2018-11-15 DIAGNOSIS — N83202 Unspecified ovarian cyst, left side: Secondary | ICD-10-CM | POA: Diagnosis not present

## 2018-12-01 NOTE — L&D Delivery Note (Signed)
Operative Delivery Note At 1:02 AM a viable female was delivered via Vaginal, Vacuum (Extractor - Mity Vac).  Presentation: vertex; Position: Right,; Station: +1.  FHR decels upon initiation of second stage, however excellent maternal effort with adequate room felt for delivery and appropriate descent progress for nullip.   Indication for operative vaginal delivery - FHR decelerations  Verbal consent: obtained from patient.  Risks and benefits discussed in detail.  Risks include, but are not limited to the risks of anesthesia, bleeding, infection, damage to maternal tissues, fetal cephalhematoma.  There is also the risk of inability to effect vaginal delivery of the head, or shoulder dystocia that cannot be resolved by established maneuvers, leading to the need for emergency cesarean section.  APGAR: 9, 9; weight  .   Placenta status: intact to L&D Cord:  3VC with the following complications:   Anesthesia:  Epidural Instruments: Mity Vac w/ bell Episiotomy: None Lacerations: right Periurethral Suture Repair: 3-0 monocryl on SH Est. Blood Loss (mL): 300  In total, one application with sero popoffs. Three pulls in total with one contractions led to delivery of fetal occiput, at which point vacuum was disengaged. Anterior and posterior shoulders then delivered on next push without issue. Baby moving all four limbs spontaneously. NICU present at delivery  Mom to postpartum.  Baby to Couplet care / Skin to Skin.  [place] 11/02/2019, 1:34 AM

## 2018-12-29 ENCOUNTER — Encounter (HOSPITAL_BASED_OUTPATIENT_CLINIC_OR_DEPARTMENT_OTHER): Payer: Self-pay | Admitting: *Deleted

## 2018-12-29 ENCOUNTER — Other Ambulatory Visit: Payer: Self-pay

## 2018-12-29 DIAGNOSIS — R102 Pelvic and perineal pain: Secondary | ICD-10-CM | POA: Diagnosis not present

## 2018-12-29 DIAGNOSIS — E282 Polycystic ovarian syndrome: Secondary | ICD-10-CM | POA: Diagnosis not present

## 2018-12-29 DIAGNOSIS — N946 Dysmenorrhea, unspecified: Secondary | ICD-10-CM | POA: Diagnosis not present

## 2018-12-29 NOTE — Progress Notes (Signed)
Spoke w/ pt via phone for pre-op interview.   Npo after mn.  Arrive at 0600.  Needs cbc, cmet, and urine preg.  Pt is unable to come in prior to surgery for lab work or pick up drink due to work.

## 2018-12-30 DIAGNOSIS — R102 Pelvic and perineal pain: Secondary | ICD-10-CM | POA: Diagnosis not present

## 2019-01-06 NOTE — Anesthesia Preprocedure Evaluation (Addendum)
Anesthesia Evaluation  Patient identified by MRN, date of birth, ID band Patient awake    Reviewed: Allergy & Precautions, NPO status , Patient's Chart, lab work & pertinent test results  Airway Mallampati: II  TM Distance: >3 FB Neck ROM: Full    Dental no notable dental hx. (+) Teeth Intact, Dental Advisory Given   Pulmonary neg pulmonary ROS,    Pulmonary exam normal breath sounds clear to auscultation       Cardiovascular Exercise Tolerance: Good negative cardio ROS Normal cardiovascular exam Rhythm:Regular Rate:Normal     Neuro/Psych negative neurological ROS  negative psych ROS   GI/Hepatic negative GI ROS,   Endo/Other    Renal/GU      Musculoskeletal   Abdominal (+) + obese,   Peds  Hematology  (+) anemia ,   Anesthesia Other Findings   Reproductive/Obstetrics negative OB ROS                            Anesthesia Physical Anesthesia Plan  ASA: II  Anesthesia Plan: General   Post-op Pain Management:    Induction: Intravenous  PONV Risk Score and Plan: 4 or greater and Treatment may vary due to age or medical condition, Dexamethasone, Ondansetron, Scopolamine patch - Pre-op and Midazolam  Airway Management Planned: Oral ETT  Additional Equipment:   Intra-op Plan:   Post-operative Plan: Extubation in OR  Informed Consent: I have reviewed the patients History and Physical, chart, labs and discussed the procedure including the risks, benefits and alternatives for the proposed anesthesia with the patient or authorized representative who has indicated his/her understanding and acceptance.     Dental advisory given  Plan Discussed with:   Anesthesia Plan Comments: (Dx laparoscopy for endometriosis)        Anesthesia Quick Evaluation

## 2019-01-06 NOTE — H&P (Signed)
Donna Marquez is an 28 y.o. female G0 with pelvic pain and dysmenorrhea.  Desires evaluation for endometriosis.  D/W pt r/b/a of diagnostic/possible operative laparoscope.  Pt also with history of PCOS.    Pertinent Gynecological History: OB History: G0, P0  Offered pt Donna Marquez, wants laparoscopic eval first.   No abn pap No STD   Menstrual History:  Patient's last menstrual period was 12/29/2018 (exact date).    Past Medical History:  Diagnosis Date  . Allergic rhinitis   . History of concussion 2017 approx.   per pt no residual  . History of mononucleosis   . Hypochromic microcytic anemia   . PCOS (polycystic ovarian syndrome)   . Pelvic pain   . S/P gastric bypass 10/27/2017   gastric sleeve  . Wears glasses     Past Surgical History:  Procedure Laterality Date  . ESOPHAGOGASTRODUODENOSCOPY (EGD) WITH PROPOFOL  09-16-2017   @[facility]   . LAPAROSCOPIC GASTRIC SLEEVE RESECTION  10-27-2017    @[facility]   . WISDOM TOOTH EXTRACTION      Family History: CAD, HTN, DM, endometriosis, prostate Ca  Social History:  reports that she has never smoked. She has never used smokeless tobacco. She reports current alcohol use. 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 Ciprofloxacin Anaphylaxis and Shortness Of Breath  . Morphine And Related Nausea And Vomiting    Meds: MVI    Review of Systems  Constitutional: Negative.   HENT: Negative.   Eyes: Negative.   Respiratory: Negative.   Cardiovascular: Negative.   Gastrointestinal: Positive for abdominal pain.  Genitourinary: Negative.   Musculoskeletal: Negative.   Skin: Negative.   Neurological: Negative.   Psychiatric/Behavioral: Negative.     Height 5\' 6"  (1.676 m), weight 102.1 kg, last menstrual period 12/29/2018. Physical Exam  Constitutional: She is oriented to person, place, and time. She appears well-developed and well-nourished.  Overweight/obese  HENT:   Head: Normocephalic and atraumatic.  Cardiovascular: Normal rate and regular rhythm.  Respiratory: Effort normal. No respiratory distress. She has no wheezes.  GI: Soft. Bowel sounds are normal. She exhibits no distension. There is abdominal tenderness.  Musculoskeletal: Normal range of motion.  Neurological: She is alert and oriented to person, place, and time.  Skin: Skin is warm and dry.  Psychiatric: She has a normal mood and affect. Her behavior is normal.    Recent eval - Ur Cx neg, GC/Chl neg; Hgb A1C WNL, TSH WNL  [place] nl uterus IUD in place, CL on R, L cyst resolved Previous US had hemorrhagic cyst on L in 10/19  Assessment/Plan: 27yo G0P0 for diagnostic/possible operative laparoscopic evaluation of pelvis. D/w pt r/b/a of surgery as well as expectations.    Donna Marquez 01/06/2019, 2:27 PM

## 2019-01-07 ENCOUNTER — Encounter (HOSPITAL_BASED_OUTPATIENT_CLINIC_OR_DEPARTMENT_OTHER): Payer: Self-pay

## 2019-01-07 ENCOUNTER — Encounter (HOSPITAL_BASED_OUTPATIENT_CLINIC_OR_DEPARTMENT_OTHER)
Admission: RE | Disposition: A | Discharge status: Home / Self Care | Payer: Self-pay | Source: Home / Self Care | Attending: Obstetrics and Gynecology

## 2019-01-07 ENCOUNTER — Ambulatory Visit (HOSPITAL_BASED_OUTPATIENT_CLINIC_OR_DEPARTMENT_OTHER)
Admission: RE | Admit: 2019-01-07 | Discharge: 2019-01-07 | Disposition: A | Discharge status: Home / Self Care | Payer: BLUE CROSS/BLUE SHIELD | Attending: Obstetrics and Gynecology | Admitting: Obstetrics and Gynecology

## 2019-01-07 ENCOUNTER — Ambulatory Visit (HOSPITAL_BASED_OUTPATIENT_CLINIC_OR_DEPARTMENT_OTHER): Payer: BLUE CROSS/BLUE SHIELD | Admitting: Anesthesiology

## 2019-01-07 DIAGNOSIS — N946 Dysmenorrhea, unspecified: Secondary | ICD-10-CM | POA: Insufficient documentation

## 2019-01-07 DIAGNOSIS — N809 Endometriosis, unspecified: Secondary | ICD-10-CM | POA: Diagnosis not present

## 2019-01-07 DIAGNOSIS — Z9884 Bariatric surgery status: Secondary | ICD-10-CM | POA: Insufficient documentation

## 2019-01-07 DIAGNOSIS — Z885 Allergy status to narcotic agent status: Secondary | ICD-10-CM | POA: Diagnosis not present

## 2019-01-07 DIAGNOSIS — R102 Pelvic and perineal pain: Secondary | ICD-10-CM | POA: Diagnosis not present

## 2019-01-07 DIAGNOSIS — E282 Polycystic ovarian syndrome: Secondary | ICD-10-CM | POA: Diagnosis not present

## 2019-01-07 DIAGNOSIS — N803 Endometriosis of pelvic peritoneum: Secondary | ICD-10-CM | POA: Insufficient documentation

## 2019-01-07 DIAGNOSIS — Z881 Allergy status to other antibiotic agents status: Secondary | ICD-10-CM | POA: Insufficient documentation

## 2019-01-07 DIAGNOSIS — N736 Female pelvic peritoneal adhesions (postinfective): Secondary | ICD-10-CM | POA: Diagnosis not present

## 2019-01-07 HISTORY — DX: Bariatric surgery status: Z98.84

## 2019-01-07 HISTORY — DX: Personal history of other infectious and parasitic diseases: Z86.19

## 2019-01-07 HISTORY — DX: Personal history of traumatic brain injury: Z87.820

## 2019-01-07 HISTORY — PX: LAPAROSCOPY: SHX197

## 2019-01-07 HISTORY — PX: FULGURATION OF LESION: SHX6726

## 2019-01-07 HISTORY — DX: Presence of spectacles and contact lenses: Z97.3

## 2019-01-07 HISTORY — DX: Iron deficiency anemia, unspecified: D50.9

## 2019-01-07 HISTORY — DX: Allergic rhinitis, unspecified: J30.9

## 2019-01-07 HISTORY — PX: LAPAROSCOPIC LYSIS OF ADHESIONS: SHX5905

## 2019-01-07 HISTORY — DX: Pelvic and perineal pain: R10.2

## 2019-01-07 LAB — COMPREHENSIVE METABOLIC PANEL
ALT: 12 U/L (ref 0–44)
AST: 21 U/L (ref 15–41)
Albumin: 4.1 g/dL (ref 3.5–5.0)
Alkaline Phosphatase: 69 U/L (ref 38–126)
Anion gap: 7 (ref 5–15)
BUN: 13 mg/dL (ref 6–20)
CALCIUM: 9.1 mg/dL (ref 8.9–10.3)
CO2: 26 mmol/L (ref 22–32)
Chloride: 104 mmol/L (ref 98–111)
Creatinine, Ser: 0.65 mg/dL (ref 0.44–1.00)
GFR calc Af Amer: >60 mL/min (ref >60)
GFR calc non Af Amer: >60 mL/min (ref >60)
GLUCOSE: 94 mg/dL (ref 70–99)
Potassium: 4 mmol/L (ref 3.5–5.1)
Sodium: 137 mmol/L (ref 135–145)
Total Bilirubin: 0.5 mg/dL (ref 0.3–1.2)
Total Protein: 7.6 g/dL (ref 6.5–8.1)

## 2019-01-07 LAB — CBC
HCT: 38.2 % (ref 36.0–46.0)
Hemoglobin: 12 g/dL (ref 12.0–15.0)
MCH: 27.6 pg (ref 26.0–34.0)
MCHC: 31.4 g/dL (ref 30.0–36.0)
MCV: 87.8 fL (ref 80.0–100.0)
Platelets: 302 10*3/uL (ref 150–400)
RBC: 4.35 MIL/uL (ref 3.87–5.11)
RDW: 13.2 % (ref 11.5–15.5)
WBC: 5.8 10*3/uL (ref 4.0–10.5)
nRBC: 0 % (ref 0.0–0.2)

## 2019-01-07 LAB — POCT PREGNANCY, URINE: Preg Test, Ur: NEGATIVE

## 2019-01-07 SURGERY — LAPAROSCOPY, DIAGNOSTIC
Anesthesia: General

## 2019-01-07 MED ORDER — DEXAMETHASONE SODIUM PHOSPHATE 10 MG/ML IJ SOLN
INTRAMUSCULAR | Status: AC
  Filled 2019-01-07: qty 1

## 2019-01-07 MED ORDER — SUGAMMADEX SODIUM 200 MG/2ML IV SOLN
INTRAVENOUS | Status: DC | PRN
  Administered 2019-01-07: 200 mg via INTRAVENOUS

## 2019-01-07 MED ORDER — LIDOCAINE 2% (20 MG/ML) 5 ML SYRINGE
INTRAMUSCULAR | Status: DC | PRN
  Administered 2019-01-07: 1.5 mg/kg/h via INTRAVENOUS

## 2019-01-07 MED ORDER — GABAPENTIN 300 MG PO CAPS
ORAL_CAPSULE | ORAL | Status: AC
  Filled 2019-01-07: qty 1

## 2019-01-07 MED ORDER — LACTATED RINGERS IV SOLN
INTRAVENOUS | Status: DC
  Administered 2019-01-07: 1000 mL via INTRAVENOUS
  Administered 2019-01-07: 07:00:00 via INTRAVENOUS
  Filled 2019-01-07: qty 1000

## 2019-01-07 MED ORDER — HYDROCODONE-ACETAMINOPHEN 5-325 MG PO TABS
ORAL_TABLET | ORAL | Status: AC
  Filled 2019-01-07: qty 1

## 2019-01-07 MED ORDER — ACETAMINOPHEN 500 MG PO TABS
1000.0000 mg | ORAL_TABLET | Freq: Once | ORAL | Status: AC
  Administered 2019-01-07: 1000 mg via ORAL
  Filled 2019-01-07: qty 2

## 2019-01-07 MED ORDER — LIDOCAINE 2% (20 MG/ML) 5 ML SYRINGE
INTRAMUSCULAR | Status: AC
  Filled 2019-01-07: qty 5

## 2019-01-07 MED ORDER — SCOPOLAMINE 1 MG/3DAYS TD PT72
MEDICATED_PATCH | TRANSDERMAL | Status: AC
  Filled 2019-01-07: qty 1

## 2019-01-07 MED ORDER — HYDROCODONE-ACETAMINOPHEN 7.5-325 MG PO TABS
1.0000 | ORAL_TABLET | Freq: Once | ORAL | Status: DC | PRN
  Filled 2019-01-07: qty 1

## 2019-01-07 MED ORDER — KETOROLAC TROMETHAMINE 30 MG/ML IJ SOLN
INTRAMUSCULAR | Status: AC
  Filled 2019-01-07: qty 1

## 2019-01-07 MED ORDER — ACETAMINOPHEN 10 MG/ML IV SOLN
1000.0000 mg | Freq: Once | INTRAVENOUS | Status: DC | PRN
  Filled 2019-01-07: qty 100

## 2019-01-07 MED ORDER — CLONIDINE HCL 0.2 MG PO TABS
ORAL_TABLET | ORAL | Status: AC
  Filled 2019-01-07: qty 1

## 2019-01-07 MED ORDER — MIDAZOLAM HCL 2 MG/2ML IJ SOLN
INTRAMUSCULAR | Status: AC
  Filled 2019-01-07: qty 2

## 2019-01-07 MED ORDER — FENTANYL CITRATE (PF) 250 MCG/5ML IJ SOLN
INTRAMUSCULAR | Status: AC
  Filled 2019-01-07: qty 5

## 2019-01-07 MED ORDER — HYDROCODONE-ACETAMINOPHEN 5-325 MG PO TABS: 1.0000 | ORAL_TABLET | Freq: Four times a day (QID) | ORAL | 0 refills | Status: DC | PRN

## 2019-01-07 MED ORDER — ONDANSETRON HCL 4 MG/2ML IJ SOLN
INTRAMUSCULAR | Status: DC | PRN
  Administered 2019-01-07: 4 mg via INTRAVENOUS

## 2019-01-07 MED ORDER — PROPOFOL 10 MG/ML IV BOLUS
INTRAVENOUS | Status: DC | PRN
  Administered 2019-01-07: 150 mg via INTRAVENOUS

## 2019-01-07 MED ORDER — BUPIVACAINE HCL (PF) 0.25 % IJ SOLN
INTRAMUSCULAR | Status: AC
  Filled 2019-01-07: qty 30

## 2019-01-07 MED ORDER — HYDROMORPHONE HCL 1 MG/ML IJ SOLN
INTRAMUSCULAR | Status: AC
  Filled 2019-01-07: qty 1

## 2019-01-07 MED ORDER — BUPIVACAINE HCL (PF) 0.25 % IJ SOLN
INTRAMUSCULAR | Status: DC | PRN
  Administered 2019-01-07: 10 mL

## 2019-01-07 MED ORDER — KETOROLAC TROMETHAMINE 30 MG/ML IJ SOLN
INTRAMUSCULAR | Status: DC | PRN
  Administered 2019-01-07: 30 mg via INTRAVENOUS

## 2019-01-07 MED ORDER — MEPERIDINE HCL 25 MG/ML IJ SOLN
6.2500 mg | INTRAMUSCULAR | Status: DC | PRN
  Filled 2019-01-07: qty 1

## 2019-01-07 MED ORDER — ACETAMINOPHEN 500 MG PO TABS
ORAL_TABLET | ORAL | Status: AC
  Filled 2019-01-07: qty 2

## 2019-01-07 MED ORDER — ONDANSETRON HCL 4 MG/2ML IJ SOLN
4.0000 mg | Freq: Once | INTRAMUSCULAR | Status: AC | PRN
  Administered 2019-01-07: 4 mg via INTRAVENOUS
  Filled 2019-01-07: qty 2

## 2019-01-07 MED ORDER — DEXMEDETOMIDINE HCL IN NACL 400 MCG/100ML IV SOLN
INTRAVENOUS | Status: AC
  Filled 2019-01-07: qty 100

## 2019-01-07 MED ORDER — SODIUM CHLORIDE 0.9 % IR SOLN
Status: DC | PRN
  Administered 2019-01-07: 1000 mL

## 2019-01-07 MED ORDER — ONDANSETRON HCL 4 MG/2ML IJ SOLN
INTRAMUSCULAR | Status: AC
  Filled 2019-01-07: qty 2

## 2019-01-07 MED ORDER — SCOPOLAMINE 1 MG/3DAYS TD PT72
1.0000 | MEDICATED_PATCH | TRANSDERMAL | Status: DC
  Administered 2019-01-07: 1.5 mg via TRANSDERMAL
  Filled 2019-01-07: qty 1

## 2019-01-07 MED ORDER — PROPOFOL 10 MG/ML IV BOLUS
INTRAVENOUS | Status: AC
  Filled 2019-01-07: qty 40

## 2019-01-07 MED ORDER — HYDROMORPHONE HCL 1 MG/ML IJ SOLN
0.2500 mg | INTRAMUSCULAR | Status: DC | PRN
  Administered 2019-01-07: 0.25 mg via INTRAVENOUS
  Filled 2019-01-07: qty 0.5

## 2019-01-07 MED ORDER — CLONIDINE HCL 0.2 MG PO TABS
0.2000 mg | ORAL_TABLET | Freq: Once | ORAL | Status: AC
  Administered 2019-01-07: 0.2 mg via ORAL
  Filled 2019-01-07: qty 1

## 2019-01-07 MED ORDER — DEXMEDETOMIDINE HCL 200 MCG/2ML IV SOLN
INTRAVENOUS | Status: DC | PRN
  Administered 2019-01-07: 12 ug via INTRAVENOUS
  Administered 2019-01-07: 8 ug via INTRAVENOUS
  Administered 2019-01-07: 12 ug via INTRAVENOUS

## 2019-01-07 MED ORDER — ARTIFICIAL TEARS OPHTHALMIC OINT
TOPICAL_OINTMENT | OPHTHALMIC | Status: AC
  Filled 2019-01-07: qty 3.5

## 2019-01-07 MED ORDER — MIDAZOLAM HCL 2 MG/2ML IJ SOLN
INTRAMUSCULAR | Status: DC | PRN
  Administered 2019-01-07: 2 mg via INTRAVENOUS

## 2019-01-07 MED ORDER — LIDOCAINE 2% (20 MG/ML) 5 ML SYRINGE
INTRAMUSCULAR | Status: DC | PRN
  Administered 2019-01-07: 100 mg via INTRAVENOUS

## 2019-01-07 MED ORDER — FENTANYL CITRATE (PF) 100 MCG/2ML IJ SOLN
INTRAMUSCULAR | Status: DC | PRN
  Administered 2019-01-07: 100 ug via INTRAVENOUS

## 2019-01-07 MED ORDER — GABAPENTIN 300 MG PO CAPS
300.0000 mg | ORAL_CAPSULE | Freq: Once | ORAL | Status: AC
  Administered 2019-01-07: 300 mg via ORAL
  Filled 2019-01-07: qty 1

## 2019-01-07 MED ORDER — ROCURONIUM BROMIDE 10 MG/ML (PF) SYRINGE
PREFILLED_SYRINGE | INTRAVENOUS | Status: DC | PRN
  Administered 2019-01-07: 50 mg via INTRAVENOUS

## 2019-01-07 MED ORDER — HYDROCODONE-ACETAMINOPHEN 5-325 MG PO TABS
1.0000 | ORAL_TABLET | Freq: Once | ORAL | Status: AC
  Administered 2019-01-07: 1 via ORAL
  Filled 2019-01-07: qty 1

## 2019-01-07 MED ORDER — LACTATED RINGERS IV SOLN
INTRAVENOUS | Status: DC
  Filled 2019-01-07: qty 1000

## 2019-01-07 MED ORDER — ROCURONIUM BROMIDE 100 MG/10ML IV SOLN
INTRAVENOUS | Status: AC
  Filled 2019-01-07: qty 1

## 2019-01-07 MED ORDER — SUGAMMADEX SODIUM 200 MG/2ML IV SOLN
INTRAVENOUS | Status: AC
  Filled 2019-01-07: qty 2

## 2019-01-07 MED ORDER — DEXAMETHASONE SODIUM PHOSPHATE 10 MG/ML IJ SOLN
INTRAMUSCULAR | Status: DC | PRN
  Administered 2019-01-07: 5 mg via INTRAVENOUS

## 2019-01-07 SURGICAL SUPPLY — 30 items
BAG RETRIEVAL 10MM (BASKET)
CABLE HIGH FREQUENCY MONO STRZ (ELECTRODE) IMPLANT
COVER MAYO STAND STRL (DRAPES) ×2 IMPLANT
COVER WAND RF STERILE (DRAPES) ×4 IMPLANT
DERMABOND ADVANCED (GAUZE/BANDAGES/DRESSINGS) ×2
DERMABOND ADVANCED .7 DNX12 (GAUZE/BANDAGES/DRESSINGS) ×2 IMPLANT
DRSG OPSITE POSTOP 3X4 (GAUZE/BANDAGES/DRESSINGS) IMPLANT
DURAPREP 26ML APPLICATOR (WOUND CARE) ×4 IMPLANT
GAUZE 4X4 16PLY RFD (DISPOSABLE) ×4 IMPLANT
GAUZE VASELINE 3X9 (GAUZE/BANDAGES/DRESSINGS) ×4 IMPLANT
GLOVE BIO SURGEON STRL SZ 6.5 (GLOVE) ×3 IMPLANT
GLOVE BIO SURGEONS STRL SZ 6.5 (GLOVE) ×1
GOWN STRL REUS W/TWL LRG LVL3 (GOWN DISPOSABLE) ×8 IMPLANT
NEEDLE INSUFFLATION 120MM (ENDOMECHANICALS) ×4 IMPLANT
NS IRRIG 500ML POUR BTL (IV SOLUTION) ×4 IMPLANT
PACK LAPAROSCOPY BASIN (CUSTOM PROCEDURE TRAY) ×4 IMPLANT
SET IRRIG TUBING LAPAROSCOPIC (IRRIGATION / IRRIGATOR) IMPLANT
SHEARS HARMONIC ACE PLUS 36CM (ENDOMECHANICALS) IMPLANT
SUT VIC AB 2-0 CT2 27 (SUTURE) IMPLANT
SUT VICRYL 0 UR6 27IN ABS (SUTURE) IMPLANT
SUT VICRYL 4-0 PS2 18IN ABS (SUTURE) ×4 IMPLANT
SYS BAG RETRIEVAL 10MM (BASKET)
SYSTEM BAG RETRIEVAL 10MM (BASKET) IMPLANT
TOWEL OR 17X24 6PK STRL BLUE (TOWEL DISPOSABLE) ×8 IMPLANT
TRAY FOLEY BAG SILVER LF 14FR (CATHETERS) ×4 IMPLANT
TROCAR BALLN 12MMX100 BLUNT (TROCAR) IMPLANT
TROCAR XCEL NON-BLD 11X100MML (ENDOMECHANICALS) IMPLANT
TROCAR XCEL NON-BLD 5MMX100MML (ENDOMECHANICALS) ×8 IMPLANT
TUBING INSUF HEATED (TUBING) ×4 IMPLANT
WARMER LAPAROSCOPE (MISCELLANEOUS) ×4 IMPLANT

## 2019-01-07 NOTE — Op Note (Signed)
Donna Marquez, Donna Marquez MEDICAL RECORD PX:[id number] ACCOUNT [id number] DATE OF BIRTH:[birth year] FACILITY: [facility] LOCATION: [facility] PHYSICIAN:Brandyn Lowrey BOVARD-STUCKERT, MD  OPERATIVE REPORT  DATE OF PROCEDURE:  01/07/2019  PREOPERATIVE DIAGNOSES:  Pain in pelvis, polycystic ovarian syndrome, clinical endometriosis.  POSTOPERATIVE DIAGNOSES:  Pain in pelvis, polycystic ovarian syndrome, clinical endometriosis,  mild endometriosis.  PROCEDURES:  Operative laparoscopy, fulguration of endometriosis, laparoscopic lysis of adhesions.  SURGEON:  Sherian Rein, MD  FINDINGS:  Normal uterus, clear anterior cul-de-sac, normal tubes and ovaries bilaterally.  Posterior cul-de-sac with 2 endometriotic implants.  Also an implant at the left uterosacral ligament.  Mild left-sided tubal adhesions  ANESTHESIA:  Local and general.  IV FLUIDS AND URINE OUTPUT:  Per Anesthesia.  ESTIMATED BLOOD LOSS:  Approximately 5 mL.  COMPLICATIONS:  None.  PATHOLOGY:  None.  DESCRIPTION OF PROCEDURE:  After informed consent was reviewed with the patient and her partner, she was transported into the operating room and placed on the table in supine position, then placed in the [place]in stirrups.  General anesthesia was induced  and appropriate timeout was performed.  She was then prepped and draped in the normal sterile fashion.  A undyed speculum was used to place a Forensic scientist.  A Foley catheter was also placed for the duration of the procedure.  Gowns and gloves were  changed.  Attention was turned to the abdominal portion of the case.  An approximately 5 mm infraumbilical incision was made. A hemostat was used to clear off the fascia.  A Veress needle was used to obtain pneumoperitoneum.  After passing the hanging  drop test, the opening pressure for direct placement of the trocar was less than 5.  The findings were as above.  Initially pneumoperitoneum was obtained prior to the placement directly of the  trocar.  Accessory port was placed in the left under direct  visualization.  The above-mentioned findings were found.  The endometriotic implants in the posterior cul-de-sac were fulgurated using the Kleppinger.  Also the uterosacral implant was ligated using the Kleppinger.  Scissors were used to lyse the  adhesions of the tube.  Then these were also fulgurated.  A pelvic survey revealed normal liver edge, normal appendix.  No other definite endometriotic implants.  Hemostasis was assured.  Pneumoperitoneum was evacuated.  Trocars were removed under direct  visualization and the trocars were both 5 mm ports.  The ports were closed with 4-0 Vicryl and Dermabond.    The patient tolerated the procedure well.    Sponge, lap and needle counts were correct x2 per the operating staff.  AN/NUANCE  D:01/07/2019 T:01/07/2019 JOB:[id number]

## 2019-01-07 NOTE — Progress Notes (Signed)
Dr. Ellyn HackBovard- Stuckert's office called about prescription not signed and no indication that script sent to pharmacy. [facility] [address] [facility] [place] They said that script had not been received for Patient. Dr. Ellyn HackBovard- Stuckert notified and stated she will call in prescription.  Patient and her boyfriend notified.

## 2019-01-07 NOTE — Interval H&P Note (Signed)
History and Physical Interval Note:  01/07/2019 8:22 AM  Donna KitchensBobbie Marquez  has presented today for surgery, with the diagnosis of pain in pelvis, polycystic ovary syndrome  The various methods of treatment have been discussed with the patient and family. After consideration of risks, benefits and other options for treatment, the patient has consented to  Procedure(s) with comments: LAPAROSCOPY DIAGNOSTIC (N/A) FULGURATION OF LESION, endomeriosis (N/A) - possible as a surgical intervention .  The patient's history has been reviewed, patient examined, no change in status, stable for surgery.  I have reviewed the patient's chart and labs.  Questions were answered to the patient's satisfaction.     Kendre Sires Bovard-Stuckert

## 2019-01-07 NOTE — Discharge Instructions (Signed)

## 2019-01-07 NOTE — Transfer of Care (Signed)
Immediate Anesthesia Transfer of Care Note  Patient: Donna Marquez  Procedure(s) Performed: LAPAROSCOPY DIAGNOSTIC (N/A ) FULGURATION OF ENDOMETRIOSIS (N/A ) LAPAROSCOPIC LYSIS OF ADHESIONS  Patient Location: PACU  Anesthesia Type:General  Level of Consciousness: awake, alert , oriented and patient cooperative  Airway & Oxygen Therapy: Patient Spontanous Breathing and Patient connected to nasal cannula oxygen  Post-op Assessment: Report given to RN and Post -op Vital signs reviewed and stable  Post vital signs: Reviewed and stable  Last Vitals:  Vitals Value Taken Time  BP    Temp    Pulse 55 01/07/2019  9:45 AM  Resp    SpO2 100 % 01/07/2019  9:45 AM  Vitals shown include unvalidated device data.  Last Pain:  Vitals:   01/07/19 0715  TempSrc:   PainSc: 4       Patients Stated Pain Goal: 7 (01/07/19 0715)  Complications: No apparent anesthesia complications

## 2019-01-07 NOTE — Brief Op Note (Signed)
01/07/2019  9:45 AM  PATIENT:  Donna Marquez  28 y.o. female  PRE-OPERATIVE DIAGNOSIS:  pain in pelvis, polycystic ovary syndrome  POST-OPERATIVE DIAGNOSIS:  pain in pelvis, polycystic ovary syndrome  PROCEDURE:  Procedure(s): LAPAROSCOPY DIAGNOSTIC (N/A) FULGURATION OF ENDOMETRIOSIS (N/A) LAPAROSCOPIC LYSIS OF ADHESIONS  SURGEON:  Surgeon(s) and Role:    * Bovard-Stuckert, Else Habermann, MD - Primary  FINDINGS: nl uterus, clear anterior cul-de-sac, nl tubes and ovaries B, posterior cul-de-sac 2 endomteriotic implants, Also at uterosacral ligament on Left.  Left sided mild tubal adhesions.    ANESTHESIA:   local and general  EBL:  5 mL IVF and uop per anesthesia  BLOOD ADMINISTERED:none  DRAINS: none   LOCAL MEDICATIONS USED:  MARCAINE     SPECIMEN:  No Specimen  DISPOSITION OF SPECIMEN:  N/A  COUNTS:  YES  TOURNIQUET:  * No tourniquets in log *  DICTATION: .Other Dictation: Dictation Number [id number]  PLAN OF CARE: Discharge to home after PACU  PATIENT DISPOSITION:  PACU - hemodynamically stable.   Delay start of Pharmacological VTE agent (>24hrs) due to surgical blood loss or risk of bleeding: not applicable

## 2019-01-07 NOTE — Anesthesia Procedure Notes (Signed)
Procedure Name: Intubation Date/Time: 01/07/2019 8:38 AM Performed by: Tyrone Nine, CRNA Pre-anesthesia Checklist: Patient identified, Emergency Drugs available, Suction available and Patient being monitored Patient Re-evaluated:Patient Re-evaluated prior to induction Oxygen Delivery Method: Circle system utilized Preoxygenation: Pre-oxygenation with 100% oxygen Induction Type: IV induction Ventilation: Mask ventilation without difficulty Grade View: Grade II Tube type: Oral Tube size: 7.0 mm Number of attempts: 1 Airway Equipment and Method: Stylet Placement Confirmation: breath sounds checked- equal and bilateral,  CO2 detector,  positive ETCO2 and ETT inserted through vocal cords under direct vision Secured at: 21 cm Tube secured with: Tape Dental Injury: Teeth and Oropharynx as per pre-operative assessment

## 2019-01-07 NOTE — Progress Notes (Signed)
Dr. Richardson LandryHouser notified that patient is having chest tightness, when up to bathroom, and denies any other symptoms. He stated he will come to see patient.  Dr. Richardson LandryHouser notified again after patient returned from bathroom to recliner and repositioned with relief of chest tightness, and continues to deny any other symptoms, and denies any further lightheadedness, or dizziness .

## 2019-01-10 ENCOUNTER — Encounter (HOSPITAL_BASED_OUTPATIENT_CLINIC_OR_DEPARTMENT_OTHER): Payer: Self-pay | Admitting: Obstetrics and Gynecology

## 2019-01-17 NOTE — Anesthesia Postprocedure Evaluation (Deleted)
Anesthesia Post Note  Patient: Donna Marquez  Procedure(s) Performed: LAPAROSCOPY DIAGNOSTIC (N/A ) FULGURATION OF ENDOMETRIOSIS (N/A ) LAPAROSCOPIC LYSIS OF ADHESIONS     Patient location during evaluation: PACU Anesthesia Type: General Level of consciousness: awake and alert Pain management: pain level controlled Vital Signs Assessment: post-procedure vital signs reviewed and stable Respiratory status: spontaneous breathing, nonlabored ventilation, respiratory function stable and patient connected to nasal cannula oxygen Cardiovascular status: blood pressure returned to baseline and stable Postop Assessment: no apparent nausea or vomiting Anesthetic complications: no    Last Vitals:  Vitals:   01/07/19 1313 01/07/19 1315  BP: 106/62 103/62  Pulse: (!) 54 65  Resp: 12 12  Temp:    SpO2: 97% 96%    Last Pain:  Vitals:   01/07/19 1415  TempSrc:   PainSc: 5                  Karstyn Birkey

## 2019-01-19 DIAGNOSIS — N809 Endometriosis, unspecified: Secondary | ICD-10-CM | POA: Diagnosis not present

## 2019-01-24 NOTE — Addendum Note (Signed)
Addendum  created 01/24/19 1721 by Val Eagle, MD   Delete clinical note, Intraprocedure Attestations deleted

## 2019-01-24 NOTE — Addendum Note (Signed)
Addendum  created 01/24/19 1723 by Val Eagle, MD   Intraprocedure Staff edited

## 2019-01-25 NOTE — Addendum Note (Signed)
Addendum  created 01/25/19 1252 by Trevor Iha, MD   Attestation recorded in Intraprocedure, Clinical Note Signed, Intraprocedure Attestations filed, Optician, dispensing edited

## 2019-01-25 NOTE — Anesthesia Postprocedure Evaluation (Signed)
Anesthesia Post Note  Patient: Donna Marquez  Procedure(s) Performed: LAPAROSCOPY DIAGNOSTIC (N/A ) FULGURATION OF ENDOMETRIOSIS (N/A ) LAPAROSCOPIC LYSIS OF ADHESIONS     Patient location during evaluation: PACU Anesthesia Type: General Level of consciousness: awake and alert Pain management: pain level controlled Vital Signs Assessment: post-procedure vital signs reviewed and stable Respiratory status: spontaneous breathing, nonlabored ventilation, respiratory function stable and patient connected to nasal cannula oxygen Cardiovascular status: blood pressure returned to baseline and stable Postop Assessment: no apparent nausea or vomiting Anesthetic complications: no    Last Vitals:  Vitals:   01/07/19 1313 01/07/19 1315  BP: 106/62 103/62  Pulse: (!) 54 65  Resp: 12 12  Temp:    SpO2: 97% 96%    Last Pain:  Vitals:   01/07/19 1415  TempSrc:   PainSc: 5                  Trevor Iha

## 2019-02-01 DIAGNOSIS — E559 Vitamin D deficiency, unspecified: Secondary | ICD-10-CM | POA: Diagnosis not present

## 2019-02-01 DIAGNOSIS — Z1329 Encounter for screening for other suspected endocrine disorder: Secondary | ICD-10-CM | POA: Diagnosis not present

## 2019-02-01 DIAGNOSIS — D649 Anemia, unspecified: Secondary | ICD-10-CM | POA: Diagnosis not present

## 2019-02-01 DIAGNOSIS — Z13220 Encounter for screening for lipoid disorders: Secondary | ICD-10-CM | POA: Diagnosis not present

## 2019-02-01 DIAGNOSIS — Z0000 Encounter for general adult medical examination without abnormal findings: Secondary | ICD-10-CM | POA: Diagnosis not present

## 2019-03-25 DIAGNOSIS — O209 Hemorrhage in early pregnancy, unspecified: Secondary | ICD-10-CM | POA: Diagnosis not present

## 2019-03-28 DIAGNOSIS — Z3A01 Less than 8 weeks gestation of pregnancy: Secondary | ICD-10-CM | POA: Diagnosis not present

## 2019-03-28 DIAGNOSIS — O3680X0 Pregnancy with inconclusive fetal viability, not applicable or unspecified: Secondary | ICD-10-CM | POA: Diagnosis not present

## 2019-03-29 DIAGNOSIS — O200 Threatened abortion: Secondary | ICD-10-CM | POA: Diagnosis not present

## 2019-03-29 DIAGNOSIS — N911 Secondary amenorrhea: Secondary | ICD-10-CM | POA: Diagnosis not present

## 2019-03-29 DIAGNOSIS — Z3201 Encounter for pregnancy test, result positive: Secondary | ICD-10-CM | POA: Diagnosis not present

## 2019-04-07 DIAGNOSIS — Z3689 Encounter for other specified antenatal screening: Secondary | ICD-10-CM | POA: Diagnosis not present

## 2019-04-07 DIAGNOSIS — Z3401 Encounter for supervision of normal first pregnancy, first trimester: Secondary | ICD-10-CM | POA: Diagnosis not present

## 2019-04-07 DIAGNOSIS — O26891 Other specified pregnancy related conditions, first trimester: Secondary | ICD-10-CM | POA: Diagnosis not present

## 2019-04-07 DIAGNOSIS — Z113 Encounter for screening for infections with a predominantly sexual mode of transmission: Secondary | ICD-10-CM | POA: Diagnosis not present

## 2019-04-07 DIAGNOSIS — Z3A08 8 weeks gestation of pregnancy: Secondary | ICD-10-CM | POA: Diagnosis not present

## 2019-04-07 DIAGNOSIS — Z368A Encounter for antenatal screening for other genetic defects: Secondary | ICD-10-CM | POA: Diagnosis not present

## 2019-04-11 LAB — OB RESULTS CONSOLE HEPATITIS B SURFACE ANTIGEN: Hepatitis B Surface Ag: NEGATIVE

## 2019-04-11 LAB — OB RESULTS CONSOLE RUBELLA ANTIBODY, IGM: Rubella: NON-IMMUNE/NOT IMMUNE

## 2019-04-21 ENCOUNTER — Other Ambulatory Visit: Payer: Self-pay

## 2019-04-21 ENCOUNTER — Encounter (HOSPITAL_COMMUNITY): Payer: Self-pay | Admitting: *Deleted

## 2019-04-21 ENCOUNTER — Observation Stay (HOSPITAL_COMMUNITY)
Admission: AD | Admit: 2019-04-21 | Discharge: 2019-04-22 | Disposition: A | Discharge status: Home / Self Care | Payer: BLUE CROSS/BLUE SHIELD | Attending: Obstetrics and Gynecology | Admitting: Obstetrics and Gynecology

## 2019-04-21 DIAGNOSIS — R509 Fever, unspecified: Secondary | ICD-10-CM | POA: Diagnosis not present

## 2019-04-21 DIAGNOSIS — Z20828 Contact with and (suspected) exposure to other viral communicable diseases: Secondary | ICD-10-CM | POA: Diagnosis not present

## 2019-04-21 DIAGNOSIS — O2301 Infections of kidney in pregnancy, first trimester: Secondary | ICD-10-CM

## 2019-04-21 DIAGNOSIS — R82998 Other abnormal findings in urine: Secondary | ICD-10-CM

## 2019-04-21 DIAGNOSIS — N898 Other specified noninflammatory disorders of vagina: Secondary | ICD-10-CM | POA: Diagnosis not present

## 2019-04-21 DIAGNOSIS — Z3A10 10 weeks gestation of pregnancy: Secondary | ICD-10-CM | POA: Insufficient documentation

## 2019-04-21 DIAGNOSIS — R51 Headache: Secondary | ICD-10-CM | POA: Insufficient documentation

## 2019-04-21 DIAGNOSIS — O3461 Maternal care for abnormality of vagina, first trimester: Secondary | ICD-10-CM | POA: Insufficient documentation

## 2019-04-21 DIAGNOSIS — O9989 Other specified diseases and conditions complicating pregnancy, childbirth and the puerperium: Principal | ICD-10-CM | POA: Insufficient documentation

## 2019-04-21 DIAGNOSIS — Z9884 Bariatric surgery status: Secondary | ICD-10-CM | POA: Diagnosis not present

## 2019-04-21 DIAGNOSIS — M545 Low back pain, unspecified: Secondary | ICD-10-CM

## 2019-04-21 HISTORY — DX: Endometriosis, unspecified: N80.9

## 2019-04-21 LAB — URINALYSIS, ROUTINE W REFLEX MICROSCOPIC
Bilirubin Urine: NEGATIVE
Glucose, UA: 150 mg/dL — AB
Hgb urine dipstick: NEGATIVE
Ketones, ur: 5 mg/dL — AB
Nitrite: NEGATIVE
Protein, ur: NEGATIVE mg/dL
Specific Gravity, Urine: 1.014 (ref 1.005–1.030)
pH: 5 (ref 5.0–8.0)

## 2019-04-21 NOTE — MAU Note (Addendum)
Fever 101.4 yesterday. Today temp 99.5. Headache and lower back pain that radiates to shoulder. Getting more uncomfortable. Mucous d/c tonight. Took 2 Tylenol about 1.5hrs ago. Does not know strength

## 2019-04-21 NOTE — MAU Provider Note (Signed)
Chief Complaint: Fever; Headache; and Back Pain  Provider saw patient at 2330     SUBJECTIVE HPI: Donna Marquez is a 28 y.o. G1P0 at 3876w3d by LMP who presents to maternity admissions reporting headache unrelieved by Tylenol, severe low back pain, and fever (up to 102 yesterday). Has had some mucous discharge today, but no itching or odor. . She denies vaginal bleeding, vaginal itching/burning, urinary symptoms, h/a, dizziness, n/v, or fever/chills.    RN note: Fever 101.4 yesterday. Today temp 99.5. Headache and lower back pain that radiates to shoulder. Getting more uncomfortable. Mucous d/c tonight. Took 2 Tylenol about 1.5hrs ago. Does not know strength  Past Medical History:  Diagnosis Date  . Allergic rhinitis   . History of concussion 2017 approx.   per pt no residual  . History of mononucleosis   . Hypochromic microcytic anemia   . PCOS (polycystic ovarian syndrome)   . Pelvic pain   . S/P gastric bypass 10/27/2017   gastric sleeve  . Wears glasses    Past Surgical History:  Procedure Laterality Date  . ESOPHAGOGASTRODUODENOSCOPY (EGD) WITH PROPOFOL  09-16-2017   @[facility]   . FULGURATION OF LESION N/A 01/07/2019   Procedure: FULGURATION OF ENDOMETRIOSIS;  Surgeon: Sherian ReinBovard-Stuckert, Jody, MD;  Location: [facility];  Service: Gynecology;  Laterality: N/A;  . LAPAROSCOPIC GASTRIC SLEEVE RESECTION  10-27-2017    @[facility]   . LAPAROSCOPIC LYSIS OF ADHESIONS  01/07/2019   Procedure: LAPAROSCOPIC LYSIS OF ADHESIONS;  Surgeon: Sherian ReinBovard-Stuckert, Jody, MD;  Location: [facility];  Service: Gynecology;;  . LAPAROSCOPY N/A 01/07/2019   Procedure: LAPAROSCOPY DIAGNOSTIC;  Surgeon: Sherian ReinBovard-Stuckert, Jody, MD;  [facility];  Service: Gynecology;  Laterality: N/A;  . WISDOM TOOTH EXTRACTION     Social History   Socioeconomic History  . Marital status: Single    Spouse name: Not on file  . Number of children: Not on file  . Years of education:  Not on file  . Highest education level: Not on file  Occupational History  . Not on file  Social Needs  . Financial resource strain: Not on file  . Food insecurity:    Worry: Not on file    Inability: Not on file  . Transportation needs:    Medical: Not on file    Non-medical: Not on file  Tobacco Use  . Smoking status: Never Smoker  . Smokeless tobacco: Never Used  Substance and Sexual Activity  . Alcohol use: Yes    Comment: occasional  . Drug use: Never  . Sexual activity: Yes    Birth control/protection: None  Lifestyle  . Physical activity:    Days per week: Not on file    Minutes per session: Not on file  . Stress: Not on file  Relationships  . Social connections:    Talks on phone: Not on file    Gets together: Not on file    Attends religious service: Not on file    Active member of club or organization: Not on file    Attends meetings of clubs or organizations: Not on file    Relationship status: Not on file  . Intimate partner violence:    Fear of current or ex partner: Not on file    Emotionally abused: Not on file    Physically abused: Not on file    Forced sexual activity: Not on file  Other Topics Concern  . Not on file  Social History Narrative  . Not on  file   No current facility-administered medications on file prior to encounter.    Current Outpatient Medications on File Prior to Encounter  Medication Sig Dispense Refill  . HYDROcodone-acetaminophen (NORCO/VICODIN) 5-325 MG tablet Take 1-2 tablets by mouth every 6 (six) hours as needed for moderate pain. 15 tablet 0  . Multiple Vitamin (MULTIVITAMIN) tablet Take 1 tablet by mouth daily.     Allergies  Allergen Reactions  . Ciprofloxacin Anaphylaxis and Shortness Of Breath  . Morphine And Related Nausea And Vomiting    I have reviewed patient's Past Medical Hx, Surgical Hx, Family Hx, Social Hx, medications and allergies.   ROS:  Review of Systems  Constitutional: Negative for chills and  fever.  HENT: Negative for sinus pain and sore throat.   Eyes: Negative for visual disturbance.  Respiratory: Negative for cough and shortness of breath.   Gastrointestinal: Negative for abdominal pain, constipation, diarrhea and nausea.  Genitourinary: Positive for frequency and vaginal discharge. Negative for difficulty urinating, dysuria, pelvic pain and vaginal bleeding.  Musculoskeletal: Positive for back pain.  Neurological: Negative for dizziness and weakness.   Review of Systems  Other systems negative   Physical Exam  Patient Vitals for the past 24 hrs:  BP Temp Pulse Resp SpO2 Height Weight  04/21/19 2211 - - - - 99 % - -  04/21/19 2208 116/68 98.4 F (36.9 C) 81 20 - - -  04/21/19 2205 - - - - - 5\' 6"  (1.676 m) 102.1 kg    Constitutional: Well-developed, well-nourished female in no acute distress.  Cardiovascular: normal rate, regular rhythm Respiratory: normal effort GI: Abd soft, non-tender. Pos BS x 4 MS: Extremities nontender, no edema, normal ROM Neurologic: Alert and oriented x 4. Grossly intact with no deficits. GU: Neg CVAT.  Lower back nontender but states pain "is deep".    PELVIC EXAM: Cervix pink, visually closed, without lesion, scant white creamy discharge, vaginal walls and external genitalia normal  FHT 160 by bedside US which showed a single fetus, quite active with normal GS  LAB RESULTS Results for orders placed or performed during the hospital encounter of 04/21/19 (from the past 24 hour(s))  Urinalysis, Routine w reflex microscopic     Status: Abnormal   Collection Time: 04/21/19 10:15 PM  Result Value Ref Range   Color, Urine YELLOW YELLOW   APPearance CLOUDY (A) CLEAR   Specific Gravity, Urine 1.014 1.005 - 1.030   pH 5.0 5.0 - 8.0   Glucose, UA 150 (A) NEGATIVE mg/dL   Hgb urine dipstick NEGATIVE NEGATIVE   Bilirubin Urine NEGATIVE NEGATIVE   Ketones, ur 5 (A) NEGATIVE mg/dL   Protein, ur NEGATIVE NEGATIVE mg/dL   Nitrite NEGATIVE  NEGATIVE   Leukocytes,Ua LARGE (A) NEGATIVE   RBC / HPF 0-5 0 - 5 RBC/hpf   WBC, UA 11-20 0 - 5 WBC/hpf   Bacteria, UA FEW (A) NONE SEEN   Squamous Epithelial / LPF 6-10 0 - 5   Mucus PRESENT   CBC     Status: Abnormal   Collection Time: 04/22/19 12:37 AM  Result Value Ref Range   WBC 11.1 (H) 4.0 - 10.5 K/uL   RBC 4.46 3.87 - 5.11 MIL/uL   Hemoglobin 12.4 12.0 - 15.0 g/dL   HCT 38.8 82.8 - 00.3 %   MCV 81.2 80.0 - 100.0 fL   MCH 27.8 26.0 - 34.0 pg   MCHC 34.3 30.0 - 36.0 g/dL   RDW 49.1 79.1 - 50.5 %  Platelets 329 150 - 400 K/uL   nRBC 0.0 0.0 - 0.2 %  hCG, quantitative, pregnancy     Status: Abnormal   Collection Time: 04/22/19 12:37 AM  Result Value Ref Range   hCG, Beta Chain, Quant, S 124,001 (H) <5 mIU/mL  Wet prep, genital     Status: Abnormal   Collection Time: 04/22/19  1:12 AM  Result Value Ref Range   Yeast Wet Prep HPF POC NONE SEEN NONE SEEN   Trich, Wet Prep NONE SEEN NONE SEEN   Clue Cells Wet Prep HPF POC NONE SEEN NONE SEEN   WBC, Wet Prep HPF POC FEW (A) NONE SEEN   Sperm NONE SEEN     IMAGING No results found.  MAU Management/MDM: Ordered usual labs.   WBC count slightly elevated Pelvic exam and wet prep done    UA showed leukocytes in urine   Urine sent for OB culture  Consult Dr Earlene Plater and Dr Mindi Slicker with presentation, exam findings, and results.  They recommend admission for observation and IV antibiotics     ASSESSMENT Single intrauterine pregnancy at [redacted]w[redacted]d Fever, unspecified fever cause  Acute bilateral low back pain without sciatica  Leukocytes in urine  Pyelonephritis affecting pregnancy in first trimester  Vaginal discharge during pregnancy in first trimester    PLAN Admit for observation on HIgh Risk unit Routine antenatal orders IV Rocephin 2 gm load then 1 gm q12 hrs Percocet x 1 given for back pain Headache cocktail given with some relief (Reglan/Benadryl/Decadron MD to follow  Wynelle Bourgeois CNM, MSN Certified  Nurse-Midwife 04/21/2019  11:37 PM

## 2019-04-22 ENCOUNTER — Encounter (HOSPITAL_COMMUNITY): Payer: Self-pay | Admitting: *Deleted

## 2019-04-22 DIAGNOSIS — O3461 Maternal care for abnormality of vagina, first trimester: Secondary | ICD-10-CM | POA: Diagnosis not present

## 2019-04-22 DIAGNOSIS — O9989 Other specified diseases and conditions complicating pregnancy, childbirth and the puerperium: Secondary | ICD-10-CM | POA: Diagnosis not present

## 2019-04-22 DIAGNOSIS — Z3A10 10 weeks gestation of pregnancy: Secondary | ICD-10-CM | POA: Diagnosis not present

## 2019-04-22 DIAGNOSIS — O2301 Infections of kidney in pregnancy, first trimester: Secondary | ICD-10-CM | POA: Diagnosis present

## 2019-04-22 DIAGNOSIS — Z20828 Contact with and (suspected) exposure to other viral communicable diseases: Secondary | ICD-10-CM | POA: Diagnosis not present

## 2019-04-22 DIAGNOSIS — R51 Headache: Secondary | ICD-10-CM | POA: Diagnosis not present

## 2019-04-22 DIAGNOSIS — M545 Low back pain: Secondary | ICD-10-CM | POA: Diagnosis not present

## 2019-04-22 DIAGNOSIS — R509 Fever, unspecified: Secondary | ICD-10-CM | POA: Diagnosis not present

## 2019-04-22 DIAGNOSIS — Z9884 Bariatric surgery status: Secondary | ICD-10-CM | POA: Diagnosis not present

## 2019-04-22 DIAGNOSIS — N898 Other specified noninflammatory disorders of vagina: Secondary | ICD-10-CM | POA: Diagnosis not present

## 2019-04-22 LAB — CBC WITH DIFFERENTIAL/PLATELET
Abs Immature Granulocytes: 0.03 10*3/uL (ref 0.00–0.07)
Basophils Absolute: 0 10*3/uL (ref 0.0–0.1)
Basophils Relative: 0 %
Eosinophils Absolute: 0 10*3/uL (ref 0.0–0.5)
Eosinophils Relative: 0 %
HCT: 33.6 % — ABNORMAL LOW (ref 36.0–46.0)
Hemoglobin: 11.5 g/dL — ABNORMAL LOW (ref 12.0–15.0)
Immature Granulocytes: 0 %
Lymphocytes Relative: 12 %
Lymphs Abs: 1.2 10*3/uL (ref 0.7–4.0)
MCH: 27.8 pg (ref 26.0–34.0)
MCHC: 34.2 g/dL (ref 30.0–36.0)
MCV: 81.2 fL (ref 80.0–100.0)
Monocytes Absolute: 0.1 10*3/uL (ref 0.1–1.0)
Monocytes Relative: 1 %
Neutro Abs: 8.2 10*3/uL — ABNORMAL HIGH (ref 1.7–7.7)
Neutrophils Relative %: 87 %
Platelets: 292 10*3/uL (ref 150–400)
RBC: 4.14 MIL/uL (ref 3.87–5.11)
RDW: 14 % (ref 11.5–15.5)
WBC: 9.6 10*3/uL (ref 4.0–10.5)
nRBC: 0 % (ref 0.0–0.2)

## 2019-04-22 LAB — CBC
HCT: 36.2 % (ref 36.0–46.0)
Hemoglobin: 12.4 g/dL (ref 12.0–15.0)
MCH: 27.8 pg (ref 26.0–34.0)
MCHC: 34.3 g/dL (ref 30.0–36.0)
MCV: 81.2 fL (ref 80.0–100.0)
Platelets: 329 10*3/uL (ref 150–400)
RBC: 4.46 MIL/uL (ref 3.87–5.11)
RDW: 14 % (ref 11.5–15.5)
WBC: 11.1 10*3/uL — ABNORMAL HIGH (ref 4.0–10.5)
nRBC: 0 % (ref 0.0–0.2)

## 2019-04-22 LAB — HIV ANTIBODY (ROUTINE TESTING W REFLEX): HIV Screen 4th Generation wRfx: NONREACTIVE

## 2019-04-22 LAB — WET PREP, GENITAL
Clue Cells Wet Prep HPF POC: NONE SEEN
Sperm: NONE SEEN
Trich, Wet Prep: NONE SEEN
Yeast Wet Prep HPF POC: NONE SEEN

## 2019-04-22 LAB — GC/CHLAMYDIA PROBE AMP (~~LOC~~) NOT AT ARMC
Chlamydia: NEGATIVE
Neisseria Gonorrhea: NEGATIVE

## 2019-04-22 LAB — POCT PREGNANCY, URINE: Preg Test, Ur: POSITIVE — AB

## 2019-04-22 LAB — HCG, QUANTITATIVE, PREGNANCY: hCG, Beta Chain, Quant, S: 124001 m[IU]/mL — ABNORMAL HIGH (ref <5)

## 2019-04-22 LAB — SARS CORONAVIRUS 2 BY RT PCR (HOSPITAL ORDER, PERFORMED IN ~~LOC~~ HOSPITAL LAB): SARS Coronavirus 2: NEGATIVE

## 2019-04-22 MED ORDER — FAMOTIDINE 20 MG PO TABS: 20.0000 mg | ORAL_TABLET | Freq: Every day | ORAL | 4 refills | Status: DC

## 2019-04-22 MED ORDER — PRENATAL MULTIVITAMIN CH: 1.0000 | ORAL_TABLET | Freq: Every day | ORAL | Status: DC

## 2019-04-22 MED ORDER — CEPHALEXIN 500 MG PO CAPS: 500.0000 mg | ORAL_CAPSULE | Freq: Three times a day (TID) | ORAL | 0 refills | Status: DC

## 2019-04-22 MED ORDER — SODIUM CHLORIDE 0.9 % IV SOLN
1.0000 g | Freq: Two times a day (BID) | INTRAVENOUS | Status: DC
  Administered 2019-04-22: 1 g via INTRAVENOUS
  Filled 2019-04-22 (×3): qty 10

## 2019-04-22 MED ORDER — DIPHENHYDRAMINE HCL 50 MG/ML IJ SOLN
25.0000 mg | Freq: Once | INTRAMUSCULAR | Status: AC
  Administered 2019-04-22: 25 mg via INTRAVENOUS
  Filled 2019-04-22: qty 1

## 2019-04-22 MED ORDER — BUTALBITAL-APAP-CAFFEINE 50-325-40 MG PO TABS
1.0000 | ORAL_TABLET | Freq: Once | ORAL | Status: AC
  Administered 2019-04-22: 08:00:00 1 via ORAL
  Filled 2019-04-22: qty 1

## 2019-04-22 MED ORDER — ACETAMINOPHEN 325 MG PO TABS: 650.0000 mg | ORAL_TABLET | ORAL | Status: DC | PRN

## 2019-04-22 MED ORDER — HYDROCODONE-ACETAMINOPHEN 5-325 MG PO TABS: 1.0000 | ORAL_TABLET | Freq: Four times a day (QID) | ORAL | 0 refills | Status: DC | PRN

## 2019-04-22 MED ORDER — METOCLOPRAMIDE HCL 5 MG/ML IJ SOLN
10.0000 mg | Freq: Once | INTRAMUSCULAR | Status: AC
  Administered 2019-04-22: 02:00:00 10 mg via INTRAVENOUS
  Filled 2019-04-22: qty 2

## 2019-04-22 MED ORDER — OXYCODONE-ACETAMINOPHEN 5-325 MG PO TABS
1.0000 | ORAL_TABLET | Freq: Once | ORAL | Status: AC
  Administered 2019-04-22: 02:00:00 1 via ORAL
  Filled 2019-04-22: qty 1

## 2019-04-22 MED ORDER — SODIUM CHLORIDE 0.9 % IV SOLN
INTRAVENOUS | Status: DC
  Administered 2019-04-22: 02:00:00 via INTRAVENOUS

## 2019-04-22 MED ORDER — LACTATED RINGERS IV SOLN: INTRAVENOUS | Status: DC

## 2019-04-22 MED ORDER — ZOLPIDEM TARTRATE 5 MG PO TABS: 5.0000 mg | ORAL_TABLET | Freq: Every evening | ORAL | Status: DC | PRN

## 2019-04-22 MED ORDER — DOCUSATE SODIUM 100 MG PO CAPS: 100.0000 mg | ORAL_CAPSULE | Freq: Every day | ORAL | Status: DC

## 2019-04-22 MED ORDER — SODIUM CHLORIDE 0.9 % IV SOLN
2.0000 g | Freq: Once | INTRAVENOUS | Status: AC
  Administered 2019-04-22: 2 g via INTRAVENOUS
  Filled 2019-04-22: qty 20

## 2019-04-22 MED ORDER — CALCIUM CARBONATE ANTACID 500 MG PO CHEW: 2.0000 | CHEWABLE_TABLET | ORAL | Status: DC | PRN

## 2019-04-22 MED ORDER — DEXAMETHASONE SODIUM PHOSPHATE 10 MG/ML IJ SOLN
10.0000 mg | Freq: Once | INTRAMUSCULAR | Status: AC
  Administered 2019-04-22: 02:00:00 10 mg via INTRAVENOUS
  Filled 2019-04-22 (×2): qty 1

## 2019-04-22 MED ORDER — FAMOTIDINE 20 MG PO TABS
20.0000 mg | ORAL_TABLET | Freq: Every day | ORAL | Status: DC
  Administered 2019-04-22: 20 mg via ORAL
  Filled 2019-04-22: qty 1

## 2019-04-22 MED ORDER — CYCLOBENZAPRINE HCL 10 MG PO TABS
10.0000 mg | ORAL_TABLET | Freq: Three times a day (TID) | ORAL | Status: DC
  Administered 2019-04-22: 10 mg via ORAL
  Filled 2019-04-22: qty 1

## 2019-04-22 NOTE — Progress Notes (Signed)
Patient ID: Donna Marquez, female   DOB: 05/06/1991, 28 y.o.   MRN: [id number]   Pt admitted w FUO/back pain/ HA  WBC improving, has received multiple doses of Rocephin.  Has slept all day.  Still feels dull HA and pressure in her back.  D/W pt might have some sort of bug.  Given SAB precautions.  F/u as scheduled 6/3  gen AFVSS  FHTs present abd soft, app tender No CVAT  Pt to call w problems.Complete antibiotics - Keflex.

## 2019-04-22 NOTE — Discharge Summary (Signed)
OB Discharge Summary     Patient Name: Donna Marquez DOB: December 16, 1990 MRN: [id number]  Date of admission: 04/21/2019 Delivering MD: This patient has no babies on file.  Date of discharge: 04/22/2019  Admitting diagnosis: VAG DIS FEVER BACK PN  Intrauterine pregnancy: [redacted]w[redacted]d     Secondary diagnosis:  Active Problems:   Pyelonephritis affecting pregnancy in first trimester  Additional problems: back pain, Headache     Discharge diagnosis: FUO, back pain, Headache - possible pyelonephritis                                                                                                 Hospital course:  Admitted, received antibiotics, d/c after several doses.  Decreasing WBC.    Physical exam  Vitals:   04/22/19 0350 04/22/19 0720 04/22/19 0721 04/22/19 1121  BP: 120/66  (!) 101/44 (!) 106/54  Pulse: 73  73 82  Resp: 16  18 18   Temp: 98.4 F (36.9 C) 98.1 F (36.7 C)  98.5 F (36.9 C)  TempSrc: Axillary Oral  Oral  SpO2: 100%  99% 99%  Weight:      Height:       General: alert and no distress Gen - NAD Labs: Lab Results  Component Value Date   WBC 9.6 04/22/2019   HGB 11.5 (L) 04/22/2019   HCT 33.6 (L) 04/22/2019   MCV 81.2 04/22/2019   PLT 292 04/22/2019   CMP Latest Ref Rng & Units 01/07/2019  Glucose 70 - 99 mg/dL 94  BUN 6 - 20 mg/dL 13  Creatinine 4.58 - 5.92 mg/dL 9.24  Sodium 462 - 863 mmol/L 137  Potassium 3.5 - 5.1 mmol/L 4.0  Chloride 98 - 111 mmol/L 104  CO2 22 - 32 mmol/L 26  Calcium 8.9 - 10.3 mg/dL 9.1  Total Protein 6.5 - 8.1 g/dL 7.6  Total Bilirubin 0.3 - 1.2 mg/dL 0.5  Alkaline Phos 38 - 126 U/L 69  AST 15 - 41 U/L 21  ALT 0 - 44 U/L 12    Discharge instruction: per After Visit Summary and "Baby and Me Booklet".  After visit meds:  Allergies as of 04/22/2019      Reactions   Ciprofloxacin Anaphylaxis, Shortness Of Breath, Swelling   Morphine Nausea And Vomiting   Nausea and vomitting   Morphine And Related Nausea And Vomiting       Medication List    TAKE these medications   acetaminophen 500 MG tablet Commonly known as:  TYLENOL Take 1,000 mg by mouth every 6 (six) hours as needed.   famotidine 20 MG tablet Commonly known as:  PEPCID Take 1 tablet (20 mg total) by mouth daily. Start taking on:  Apr 23, 2019   HYDROcodone-acetaminophen 5-325 MG tablet Commonly known as:  NORCO/VICODIN Take 1-2 tablets by mouth every 6 (six) hours as needed for up to 2 days for moderate pain.   prenatal multivitamin Tabs tablet Take 1 tablet by mouth daily at 12 noon.       Diet: routine diet  Activity: Advance as tolerated. Pelvic rest for 6 weeks.  Outpatient follow up:As scheduled Follow up Appt:No future appointments. Follow up Visit:No follow-ups on file.    04/22/2019 Sherian ReinJody Bovard-Stuckert, MD

## 2019-04-22 NOTE — Progress Notes (Signed)
Patient ID: Donna Marquez, female   DOB: 04-27-1991, 28 y.o.   MRN: [id number]   HD #1 - admitted for FUO/back pain/?HA  HA was improved.  Back pain improved.  White count improved.  On rocephin for poss pyelo.  Urine culture initially wasn't sent, should be Pending. States awoke w HA, but hasn't eaten since yesterday PM.    AFVSS gen NAD Abd soft, FNT Back no CVAT  Rocephin due at 3 pm.   Treat w meds for GERD to help nausea  Likely d/c as day goes on if improving.  F/u in office.  D/C with antibiotics to complete a 10 day course.

## 2019-04-22 NOTE — H&P (Addendum)
Chief Complaint: Fever; Headache; and Back Pain  Provider saw patient at 2330     SUBJECTIVE HPI: Donna Marquez is a 28 y.o. G1P0 at [redacted]w[redacted]d by LMP who presents to maternity admissions reporting headache unrelieved by Tylenol, severe low back pain, and fever (up to 102 yesterday). Has had some mucous discharge today, but no itching or odor or bleeding.  She denies vaginal bleeding, vaginal itching/burning, urinary symptoms, h/a, dizziness, n/v, or fever/chills.    RN note: Fever 101.4 yesterday. Today temp 99.5. Headache and lower back pain that radiates to shoulder. Getting more uncomfortable. Mucous d/c tonight. Took 2 Tylenol about 1.5hrs ago. Does not know strength  Past Medical History:  Diagnosis Date  . Allergic rhinitis   . Endometriosis   . History of concussion 2017 approx.   per pt no residual  . History of mononucleosis   . Hypochromic microcytic anemia   . PCOS (polycystic ovarian syndrome)   . Pelvic pain   . S/P gastric bypass 10/27/2017   gastric sleeve  . Wears glasses    Past Surgical History:  Procedure Laterality Date  . ESOPHAGOGASTRODUODENOSCOPY (EGD) WITH PROPOFOL  09-16-2017   @[facility]   . FULGURATION OF LESION N/A 01/07/2019   Procedure: FULGURATION OF ENDOMETRIOSIS;  Surgeon: Sherian Rein, MD;  Location: [facility];  Service: Gynecology;  Laterality: N/A;  . LAPAROSCOPIC GASTRIC SLEEVE RESECTION  10-27-2017    @[facility]   . LAPAROSCOPIC LYSIS OF ADHESIONS  01/07/2019   Procedure: LAPAROSCOPIC LYSIS OF ADHESIONS;  Surgeon: Sherian Rein, MD;  Location: [facility];  Service: Gynecology;;  . LAPAROSCOPY N/A 01/07/2019   Procedure: LAPAROSCOPY DIAGNOSTIC;  Surgeon: Sherian Rein, MD;  Location: [facility];  Service: Gynecology;  Laterality: N/A;  . WISDOM TOOTH EXTRACTION     Social History   Socioeconomic History  . Marital status: Single    Spouse name: Not on file  . Number of children: Not on  file  . Years of education: Not on file  . Highest education level: Not on file  Occupational History  . Not on file  Social Needs  . Financial resource strain: Not on file  . Food insecurity:    Worry: Not on file    Inability: Not on file  . Transportation needs:    Medical: Not on file    Non-medical: Not on file  Tobacco Use  . Smoking status: Never Smoker  . Smokeless tobacco: Never Used  Substance and Sexual Activity  . Alcohol use: Yes    Comment: occasional  . Drug use: Never  . Sexual activity: Yes    Birth control/protection: None  Lifestyle  . Physical activity:    Days per week: Not on file    Minutes per session: Not on file  . Stress: Not on file  Relationships  . Social connections:    Talks on phone: Not on file    Gets together: Not on file    Attends religious service: Not on file    Active member of club or organization: Not on file    Attends meetings of clubs or organizations: Not on file    Relationship status: Not on file  . Intimate partner violence:    Fear of current or ex partner: Not on file    Emotionally abused: Not on file    Physically abused: Not on file    Forced sexual activity: Not on file  Other Topics Concern  . Not on file  Social  History Narrative  . Not on file   No current facility-administered medications on file prior to encounter.    Current Outpatient Medications on File Prior to Encounter  Medication Sig Dispense Refill  . acetaminophen (TYLENOL) 500 MG tablet Take 1,000 mg by mouth every 6 (six) hours as needed.    . Multiple Vitamin (MULTIVITAMIN) tablet Take 1 tablet by mouth daily.    Marland Kitchen HYDROcodone-acetaminophen (NORCO/VICODIN) 5-325 MG tablet Take 1-2 tablets by mouth every 6 (six) hours as needed for moderate pain. 15 tablet 0   Allergies  Allergen Reactions  . Ciprofloxacin Anaphylaxis, Shortness Of Breath and Swelling  . Morphine Nausea And Vomiting    Nausea and vomitting  . Morphine And Related Nausea  And Vomiting    I have reviewed patient's Past Medical Hx, Surgical Hx, Family Hx, Social Hx, medications and allergies.   ROS:  Review of Systems  Constitutional: Negative for chills and fever.  HENT: Negative for sinus pain and sore throat.   Eyes: Negative for visual disturbance.  Respiratory: Negative for cough and shortness of breath.   Gastrointestinal: Negative for abdominal pain, constipation, diarrhea and nausea.  Genitourinary: Positive for frequency and vaginal discharge. Negative for difficulty urinating, dysuria, pelvic pain and vaginal bleeding.  Musculoskeletal: Positive for back pain.  Neurological: Negative for dizziness and weakness.   Review of Systems  Other systems negative   Physical Exam   Patient Vitals for the past 24 hrs:  BP Temp Temp src Pulse Resp SpO2 Height Weight  04/22/19 0350 120/66 98.4 F (36.9 C) Axillary 73 16 100 % - -  04/21/19 2211 - - - - - 99 % - -  04/21/19 2208 116/68 98.4 F (36.9 C) - 81 20 - - -  04/21/19 2205 - - - - - -  (1.676 m) 102.1 kg    Constitutional: Well-developed, well-nourished female in no acute distress.  Cardiovascular: normal rate, regular rhythm Respiratory: normal effort GI: Abd soft, non-tender. Pos BS x 4 MS: Extremities nontender, no edema, normal ROM Neurologic: Alert and oriented x 4. Grossly intact with no deficits. GU: Neg CVAT.  Lower back nontender but states pain "is deep".    PELVIC EXAM: Cervix pink, visually closed, without lesion, scant white creamy discharge, vaginal walls and external genitalia normal  FHT 160 by bedside US which showed a single fetus, quite active with normal GS  LAB RESULTS Results for orders placed or performed during the hospital encounter of 04/21/19 (from the past 24 hour(s))  Urinalysis, Routine w reflex microscopic     Status: Abnormal   Collection Time: 04/21/19 10:15 PM  Result Value Ref Range   Color, Urine YELLOW YELLOW   APPearance CLOUDY (A) CLEAR    Specific Gravity, Urine 1.014 1.005 - 1.030   pH 5.0 5.0 - 8.0   Glucose, UA 150 (A) NEGATIVE mg/dL   Hgb urine dipstick NEGATIVE NEGATIVE   Bilirubin Urine NEGATIVE NEGATIVE   Ketones, ur 5 (A) NEGATIVE mg/dL   Protein, ur NEGATIVE NEGATIVE mg/dL   Nitrite NEGATIVE NEGATIVE   Leukocytes,Ua LARGE (A) NEGATIVE   RBC / HPF 0-5 0 - 5 RBC/hpf   WBC, UA 11-20 0 - 5 WBC/hpf   Bacteria, UA FEW (A) NONE SEEN   Squamous Epithelial / LPF 6-10 0 - 5   Mucus PRESENT   CBC     Status: Abnormal   Collection Time: 04/22/19 12:37 AM  Result Value Ref Range   WBC 11.1 (H) 4.0 -  10.5 K/uL   RBC 4.46 3.87 - 5.11 MIL/uL   Hemoglobin 12.4 12.0 - 15.0 g/dL   HCT 40.936.2 81.136.0 - 91.446.0 %   MCV 81.2 80.0 - 100.0 fL   MCH 27.8 26.0 - 34.0 pg   MCHC 34.3 30.0 - 36.0 g/dL   RDW 78.214.0 95.611.5 - 21.315.5 %   Platelets 329 150 - 400 K/uL   nRBC 0.0 0.0 - 0.2 %  hCG, quantitative, pregnancy     Status: Abnormal   Collection Time: 04/22/19 12:37 AM  Result Value Ref Range   hCG, Beta Chain, Quant, S 124,001 (H) <5 mIU/mL  SARS Coronavirus 2 (CEPHEID - Performed [facility] lab), Hosp Order     Status: None   Collection Time: 04/22/19  1:12 AM  Result Value Ref Range   SARS Coronavirus 2 NEGATIVE NEGATIVE  Wet prep, genital     Status: Abnormal   Collection Time: 04/22/19  1:12 AM  Result Value Ref Range   Yeast Wet Prep HPF POC NONE SEEN NONE SEEN   Trich, Wet Prep NONE SEEN NONE SEEN   Clue Cells Wet Prep HPF POC NONE SEEN NONE SEEN   WBC, Wet Prep HPF POC FEW (A) NONE SEEN   Sperm NONE SEEN     IMAGING No results found.  MAU Management/MDM: Ordered usual labs.   WBC count slightly elevated Pelvic exam and wet prep done    UA showed leukocytes in urine   Urine sent for OB culture  Consult Dr Earlene Plateravis and Dr Mindi SlickerBanga with presentation, exam findings, and results.  They recommend admission for observation and IV antibiotics     ASSESSMENT Single intrauterine pregnancy at [id number] Fever, unspecified  fever cause  Acute bilateral low back pain without sciatica  Leukocytes in urine  Pyelonephritis affecting pregnancy in first trimester  Vaginal discharge during pregnancy in first trimester    PLAN Admit for observation on HIgh Risk unit Routine antenatal orders IV Rocephin 2 gm load Percocet x 1 given for back pain Headache cocktail given with some relief (Reglan/Benadryl/Decadron Discharge to home later today anticipated

## 2019-04-22 NOTE — MAU Note (Signed)
Covid 19 nasal swab obtained without difficulty. Pt tol well.

## 2019-04-22 NOTE — Progress Notes (Signed)
Vag swabs obtained by blind swab by RN. PT tol well

## 2019-04-22 NOTE — Plan of Care (Signed)
Medicated for continued pain

## 2019-04-22 NOTE — Progress Notes (Signed)
Patient ID: Donna Marquez, female   DOB: May 07, 1991, 28 y.o.   MRN: [id number]  HD#1 Pt reports HA was relieved with percocet but just resumed when woke up this am. She also reports continued pain in low back. She denies any point tenderness; describes pain as diffuse over lower back. Hx upper back injury in high school. Feels "rough" VS : Tmax 98.4 since admission, 120/66, 73 GEN - NAD, appears tired ABD - soft, ND, NT BACK: no flank pain, no lesions  A/P: Prime at 10 4/7wks with presumptive pyelonephritis and HA         Recheck CBC         Continue rocephin         Flexeril for back pain         Fioricet for HA         Discussed discharge to home later today if sx improve; remains afebrile

## 2019-04-24 LAB — CULTURE, OB URINE: Culture: <10000 — AB

## 2019-05-04 DIAGNOSIS — Z3682 Encounter for antenatal screening for nuchal translucency: Secondary | ICD-10-CM | POA: Diagnosis not present

## 2019-05-04 DIAGNOSIS — Z3A12 12 weeks gestation of pregnancy: Secondary | ICD-10-CM | POA: Diagnosis not present

## 2019-06-23 DIAGNOSIS — Z3689 Encounter for other specified antenatal screening: Secondary | ICD-10-CM | POA: Diagnosis not present

## 2019-06-23 DIAGNOSIS — Z3A19 19 weeks gestation of pregnancy: Secondary | ICD-10-CM | POA: Diagnosis not present

## 2019-06-29 DIAGNOSIS — J069 Acute upper respiratory infection, unspecified: Secondary | ICD-10-CM | POA: Diagnosis not present

## 2019-06-29 DIAGNOSIS — Z3A00 Weeks of gestation of pregnancy not specified: Secondary | ICD-10-CM | POA: Diagnosis not present

## 2019-06-29 DIAGNOSIS — J029 Acute pharyngitis, unspecified: Secondary | ICD-10-CM | POA: Diagnosis not present

## 2019-06-29 DIAGNOSIS — Z20828 Contact with and (suspected) exposure to other viral communicable diseases: Secondary | ICD-10-CM | POA: Diagnosis not present

## 2019-06-29 DIAGNOSIS — Z3492 Encounter for supervision of normal pregnancy, unspecified, second trimester: Secondary | ICD-10-CM | POA: Diagnosis not present

## 2019-08-19 ENCOUNTER — Other Ambulatory Visit: Payer: Self-pay

## 2019-08-19 ENCOUNTER — Encounter (HOSPITAL_COMMUNITY): Payer: Self-pay

## 2019-08-19 ENCOUNTER — Inpatient Hospital Stay (HOSPITAL_COMMUNITY)
Admission: AD | Admit: 2019-08-19 | Discharge: 2019-08-19 | Disposition: A | Discharge status: Home / Self Care | Payer: BC Managed Care – PPO | Attending: Obstetrics and Gynecology | Admitting: Obstetrics and Gynecology

## 2019-08-19 DIAGNOSIS — Z3A27 27 weeks gestation of pregnancy: Secondary | ICD-10-CM | POA: Diagnosis not present

## 2019-08-19 DIAGNOSIS — Z881 Allergy status to other antibiotic agents status: Secondary | ICD-10-CM | POA: Insufficient documentation

## 2019-08-19 DIAGNOSIS — Z885 Allergy status to narcotic agent status: Secondary | ICD-10-CM | POA: Insufficient documentation

## 2019-08-19 DIAGNOSIS — O99352 Diseases of the nervous system complicating pregnancy, second trimester: Secondary | ICD-10-CM | POA: Diagnosis not present

## 2019-08-19 DIAGNOSIS — O99842 Bariatric surgery status complicating pregnancy, second trimester: Secondary | ICD-10-CM | POA: Diagnosis not present

## 2019-08-19 DIAGNOSIS — R350 Frequency of micturition: Secondary | ICD-10-CM | POA: Diagnosis not present

## 2019-08-19 DIAGNOSIS — R51 Headache: Secondary | ICD-10-CM

## 2019-08-19 DIAGNOSIS — Z23 Encounter for immunization: Secondary | ICD-10-CM | POA: Diagnosis not present

## 2019-08-19 DIAGNOSIS — O26892 Other specified pregnancy related conditions, second trimester: Secondary | ICD-10-CM

## 2019-08-19 DIAGNOSIS — G44209 Tension-type headache, unspecified, not intractable: Secondary | ICD-10-CM

## 2019-08-19 DIAGNOSIS — Z3689 Encounter for other specified antenatal screening: Secondary | ICD-10-CM | POA: Diagnosis not present

## 2019-08-19 DIAGNOSIS — I10 Essential (primary) hypertension: Secondary | ICD-10-CM | POA: Diagnosis present

## 2019-08-19 LAB — CBC
HCT: 30.6 % — ABNORMAL LOW (ref 36.0–46.0)
Hemoglobin: 10.3 g/dL — ABNORMAL LOW (ref 12.0–15.0)
MCH: 29.5 pg (ref 26.0–34.0)
MCHC: 33.7 g/dL (ref 30.0–36.0)
MCV: 87.7 fL (ref 80.0–100.0)
Platelets: 309 10*3/uL (ref 150–400)
RBC: 3.49 MIL/uL — ABNORMAL LOW (ref 3.87–5.11)
RDW: 12.9 % (ref 11.5–15.5)
WBC: 12 10*3/uL — ABNORMAL HIGH (ref 4.0–10.5)
nRBC: 0 % (ref 0.0–0.2)

## 2019-08-19 LAB — COMPREHENSIVE METABOLIC PANEL
ALT: 15 U/L (ref 0–44)
AST: 17 U/L (ref 15–41)
Albumin: 2.8 g/dL — ABNORMAL LOW (ref 3.5–5.0)
Alkaline Phosphatase: 81 U/L (ref 38–126)
Anion gap: 11 (ref 5–15)
BUN: 8 mg/dL (ref 6–20)
CO2: 22 mmol/L (ref 22–32)
Calcium: 8.6 mg/dL — ABNORMAL LOW (ref 8.9–10.3)
Chloride: 102 mmol/L (ref 98–111)
Creatinine, Ser: 0.58 mg/dL (ref 0.44–1.00)
GFR calc Af Amer: >60 mL/min (ref >60)
GFR calc non Af Amer: >60 mL/min (ref >60)
Glucose, Bld: 83 mg/dL (ref 70–99)
Potassium: 3.4 mmol/L — ABNORMAL LOW (ref 3.5–5.1)
Sodium: 135 mmol/L (ref 135–145)
Total Bilirubin: 0.2 mg/dL — ABNORMAL LOW (ref 0.3–1.2)
Total Protein: 6.3 g/dL — ABNORMAL LOW (ref 6.5–8.1)

## 2019-08-19 LAB — PROTEIN / CREATININE RATIO, URINE
Creatinine, Urine: 86.59 mg/dL
Protein Creatinine Ratio: 0.08 mg/mg{Cre} (ref 0.00–0.15)
Total Protein, Urine: 7 mg/dL

## 2019-08-19 LAB — OB RESULTS CONSOLE HIV ANTIBODY (ROUTINE TESTING): HIV: NONREACTIVE

## 2019-08-19 LAB — URINALYSIS, ROUTINE W REFLEX MICROSCOPIC
Bilirubin Urine: NEGATIVE
Glucose, UA: 50 mg/dL — AB
Hgb urine dipstick: NEGATIVE
Ketones, ur: 5 mg/dL — AB
Leukocytes,Ua: NEGATIVE
Nitrite: NEGATIVE
Protein, ur: NEGATIVE mg/dL
Specific Gravity, Urine: 1.012 (ref 1.005–1.030)
pH: 6 (ref 5.0–8.0)

## 2019-08-19 MED ORDER — ACETAMINOPHEN 500 MG PO TABS
1000.0000 mg | ORAL_TABLET | Freq: Four times a day (QID) | ORAL | Status: DC | PRN
  Administered 2019-08-19: 1000 mg via ORAL
  Filled 2019-08-19: qty 2

## 2019-08-19 NOTE — MAU Provider Note (Signed)
History     CSN: [id number]  Arrival date and time: 08/19/19 8546   First Provider Initiated Contact with Patient 08/19/19 1937      Chief Complaint  Patient presents with  . Hypertension   28 y.o. G1 @27 .4 wks presenting with elevated BP at the office. Reports frontal HA since earlier today. Rates 4/10. Has not taken anything for it. Denies HA, visual disturbances, RUQ pain, SOB, and CP. +FM. No VB, LOF, or ctx. Admits to only 3 bottles of water today.    OB History    Gravida  1   Para      Term      Preterm      AB      Living        SAB      TAB      Ectopic      Multiple      Live Births              Past Medical History:  Diagnosis Date  . Allergic rhinitis   . Endometriosis   . History of concussion 2017 approx.   per pt no residual  . History of mononucleosis   . Hypochromic microcytic anemia   . PCOS (polycystic ovarian syndrome)   . Pelvic pain   . S/P gastric bypass 10/27/2017   gastric sleeve  . Wears glasses     Past Surgical History:  Procedure Laterality Date  . ESOPHAGOGASTRODUODENOSCOPY (EGD) WITH PROPOFOL  09-16-2017   @[facility]   . FULGURATION OF LESION N/A 01/07/2019   Procedure: FULGURATION OF ENDOMETRIOSIS;  Surgeon: Janyth Contes, MD;  Location: [place];  Service: Gynecology;  Laterality: N/A;  . LAPAROSCOPIC GASTRIC SLEEVE RESECTION  10-27-2017    @[facility]   . LAPAROSCOPIC LYSIS OF ADHESIONS  01/07/2019   Procedure: LAPAROSCOPIC LYSIS OF ADHESIONS;  Surgeon: Janyth Contes, MD;  Location: [place];  Service: Gynecology;;  . LAPAROSCOPY N/A 01/07/2019   Procedure: LAPAROSCOPY DIAGNOSTIC;  Surgeon: Janyth Contes, MD;  Location: [place];  Service: Gynecology;  Laterality: N/A;  . WISDOM TOOTH EXTRACTION      History reviewed. No pertinent family history.  Social History   Tobacco Use  . Smoking status: Never Smoker  . Smokeless tobacco: Never Used   Substance Use Topics  . Alcohol use: Yes    Comment: occasional  . Drug use: Never    Allergies:  Allergies  Allergen Reactions  . Ciprofloxacin Anaphylaxis, Shortness Of Breath and Swelling  . Morphine Nausea And Vomiting    Nausea and vomitting  . Morphine And Related Nausea And Vomiting    Medications Prior to Admission  Medication Sig Dispense Refill Last Dose  . Prenatal Vit-Fe Fumarate-FA (PRENATAL MULTIVITAMIN) TABS tablet Take 1 tablet by mouth daily at 12 noon.   08/18/2019 at Unknown time  . acetaminophen (TYLENOL) 500 MG tablet Take 1,000 mg by mouth every 6 (six) hours as needed.   More than a month at Unknown time  . cephALEXin (KEFLEX) 500 MG capsule Take 1 capsule (500 mg total) by mouth 3 (three) times daily. 30 capsule 0   . famotidine (PEPCID) 20 MG tablet Take 1 tablet (20 mg total) by mouth daily. 90 tablet 4 More than a month at Unknown time  . HYDROcodone-acetaminophen (NORCO) 5-325 MG tablet Take 1 tablet by mouth every 6 (six) hours as needed for moderate pain or severe pain. 5 tablet 0     Review of Systems  Eyes: Negative for visual disturbance.  Gastrointestinal: Negative for abdominal pain.  Genitourinary: Negative for vaginal bleeding.  Neurological: Positive for headaches.   Physical Exam   Blood pressure 132/62, pulse 80, temperature 98.4 F (36.9 C), resp. rate 16, last menstrual period 12/29/2018, SpO2 99 %. Patient Vitals for the past 24 hrs:  BP Temp Pulse Resp SpO2  08/19/19 2015 132/62 - 80 - 99 %  08/19/19 2000 126/74 - 84 - 98 %  08/19/19 1945 129/72 - 82 - -  08/19/19 1930 (!) 116/59 - 82 - -  08/19/19 1927 (!) 116/59 - 90 - -  08/19/19 1656 131/70 98.4 F (36.9 C) 92 16 -    Physical Exam  Nursing note and vitals reviewed. Constitutional: She is oriented to person, place, and time. She appears well-developed and well-nourished. No distress.  HENT:  Head: Normocephalic and atraumatic.  Neck: Normal range of motion.   Cardiovascular: Normal rate.  Respiratory: Effort normal. No respiratory distress.  Musculoskeletal: Normal range of motion.  Neurological: She is alert and oriented to person, place, and time.  Psychiatric: She has a normal mood and affect.  EFM: 145 bpm, mod variability, + accels, no decels Toco: none  Results for orders placed or performed during the hospital encounter of 08/19/19 (from the past 24 hour(s))  Urinalysis, Routine w reflex microscopic     Status: Abnormal   Collection Time: 08/19/19  7:23 PM  Result Value Ref Range   Color, Urine YELLOW YELLOW   APPearance CLEAR CLEAR   Specific Gravity, Urine 1.012 1.005 - 1.030   pH 6.0 5.0 - 8.0   Glucose, UA 50 (A) NEGATIVE mg/dL   Hgb urine dipstick NEGATIVE NEGATIVE   Bilirubin Urine NEGATIVE NEGATIVE   Ketones, ur 5 (A) NEGATIVE mg/dL   Protein, ur NEGATIVE NEGATIVE mg/dL   Nitrite NEGATIVE NEGATIVE   Leukocytes,Ua NEGATIVE NEGATIVE  Protein / creatinine ratio, urine     Status: None   Collection Time: 08/19/19  7:23 PM  Result Value Ref Range   Creatinine, Urine 86.59 mg/dL   Total Protein, Urine 7 mg/dL   Protein Creatinine Ratio 0.08 0.00 - 0.15 mg/mg[Cre]  CBC     Status: Abnormal   Collection Time: 08/19/19  7:29 PM  Result Value Ref Range   WBC 12.0 (H) 4.0 - 10.5 K/uL   RBC 3.49 (L) 3.87 - 5.11 MIL/uL   Hemoglobin 10.3 (L) 12.0 - 15.0 g/dL   HCT 78.2 (L) 95.6 - 21.3 %   MCV 87.7 80.0 - 100.0 fL   MCH 29.5 26.0 - 34.0 pg   MCHC 33.7 30.0 - 36.0 g/dL   RDW 08.6 57.8 - 46.9 %   Platelets 309 150 - 400 K/uL   nRBC 0.0 0.0 - 0.2 %  Comprehensive metabolic panel     Status: Abnormal   Collection Time: 08/19/19  7:29 PM  Result Value Ref Range   Sodium 135 135 - 145 mmol/L   Potassium 3.4 (L) 3.5 - 5.1 mmol/L   Chloride 102 98 - 111 mmol/L   CO2 22 22 - 32 mmol/L   Glucose, Bld 83 70 - 99 mg/dL   BUN 8 6 - 20 mg/dL   Creatinine, Ser 6.29 0.44 - 1.00 mg/dL   Calcium 8.6 (L) 8.9 - 10.3 mg/dL   Total Protein  6.3 (L) 6.5 - 8.1 g/dL   Albumin 2.8 (L) 3.5 - 5.0 g/dL   AST 17 15 - 41 U/L   ALT 15 0 -  44 U/L   Alkaline Phosphatase 81 38 - 126 U/L   Total Bilirubin 0.2 (L) 0.3 - 1.2 mg/dL   GFR calc non Af Amer >60 >60 mL/min   GFR calc Af Amer >60 >60 mL/min   Anion gap 11 5 - 15   MAU Course  Procedures Meds ordered this encounter  Medications  . acetaminophen (TYLENOL) tablet 1,000 mg   MDM Labs ordered and reviewed. HA improved. No evidence of PEC. Stable for discharge home.   Assessment and Plan   1. [redacted] weeks gestation of pregnancy   2. Acute non intractable tension-type headache    Discharge home [facility] as scheduled PEC precautions  Allergies as of 08/19/2019      Reactions   Ciprofloxacin Anaphylaxis, Shortness Of Breath, Swelling   Morphine Nausea And Vomiting   Nausea and vomitting   Morphine And Related Nausea And Vomiting      Medication List    STOP taking these medications   cephALEXin 500 MG capsule Commonly known as: Keflex   HYDROcodone-acetaminophen 5-325 MG tablet Commonly known as: Norco     TAKE these medications   acetaminophen 500 MG tablet Commonly known as: TYLENOL Take 1,000 mg by mouth every 6 (six) hours as needed.   famotidine 20 MG tablet Commonly known as: PEPCID Take 1 tablet (20 mg total) by mouth daily.   prenatal multivitamin Tabs tablet Take 1 tablet by mouth daily at 12 noon.      Donette LarryMelanie Venesa Semidey, CNM 08/19/2019, 8:45 PM

## 2019-08-19 NOTE — Discharge Instructions (Signed)
Hypertension During Pregnancy °Hypertension is also called high blood pressure. High blood pressure means that the force of your blood moving in your body is too strong. It can cause problems for you and your baby. Different types of high blood pressure can happen during pregnancy. The types are: °· High blood pressure before you got pregnant. This is called chronic hypertension.  This can continue during your pregnancy. Your doctor will want to keep checking your blood pressure. You may need medicine to keep your blood pressure under control while you are pregnant. You will need follow-up visits after you have your baby. °· High blood pressure that goes up during pregnancy when it was normal before. This is called gestational hypertension. It will usually get better after you have your baby, but your doctor will need to watch your blood pressure to make sure that it is getting better. °· Very high blood pressure during pregnancy. This is called preeclampsia. Very high blood pressure is an emergency that needs to be checked and treated right away. °· You may develop very high blood pressure after giving birth. This is called postpartum preeclampsia. This usually occurs within 48 hours after childbirth but may occur up to 6 weeks after giving birth. This is rare. °How does this affect me? °If you have high blood pressure during pregnancy, you have a higher chance of developing high blood pressure: °· As you get older. °· If you get pregnant again. °In some cases, high blood pressure during pregnancy can cause: °· Stroke. °· Heart attack. °· Damage to the kidneys, lungs, or liver. °· Preeclampsia. °· Jerky movements you cannot control (convulsions or seizures). °· Problems with the placenta. °How does this affect my baby? °Your baby may: °· Be born early. °· Not weigh as much as he or she should. °· Not handle labor well, leading to a c-section birth. °What are the risks? °· Having high blood pressure during a past  pregnancy. °· Being overweight. °· Being 35 years old or older. °· Being pregnant for the first time. °· Being pregnant with more than one baby. °· Becoming pregnant using fertility methods, such as IVF. °· Having other problems, such as diabetes, or kidney disease. °· Having family members who have high blood pressure. °What can I do to lower my risk? ° °· Keep a healthy weight. °· Eat a healthy diet. °· Follow what your doctor tells you about treating any medical problems that you had before becoming pregnant. °It is very important to go to all of your doctor visits. Your doctor will check your blood pressure and make sure that your pregnancy is progressing as it should. Treatment should start early if a problem is found. °How is this treated? °Treatment for high blood pressure during pregnancy can differ depending on the type of high blood pressure you have and how serious it is. °· You may need to take blood pressure medicine. °· If you have been taking medicine for your blood pressure, you may need to change the medicine during pregnancy if it is not safe for your baby. °· If your doctor thinks that you could get very high blood pressure, he or she may tell you to take a low-dose aspirin during your pregnancy. °· If you have very high blood pressure, you may need to stay in the hospital so you and your baby can be watched closely. You may also need to take medicine to lower your blood pressure. This medicine may be given by mouth   or through an IV tube.  In some cases, if your condition gets worse, you may need to have your baby early. Follow these instructions at home: Eating and drinking   Drink enough fluid to keep your pee (urine) pale yellow.  Avoid caffeine. Lifestyle  Do not use any products that contain nicotine or tobacco, such as cigarettes, e-cigarettes, and chewing tobacco. If you need help quitting, ask your doctor.  Do not use alcohol or drugs.  Avoid stress.  Rest and get plenty  of sleep.  Regular exercise can help. Ask your doctor what kinds of exercise are best for you. General instructions  Take over-the-counter and prescription medicines only as told by your doctor.  Keep all prenatal and follow-up visits as told by your doctor. This is important. Contact a doctor if:  You have symptoms that your doctor told you to watch for, such as: ? Headaches. ? Nausea. ? Vomiting. ? Belly (abdominal) pain. ? Dizziness. ? Light-headedness. Get help right away if:  You have: ? Very bad belly pain that does not get better with treatment. ? A very bad headache that does not get better. ? Vomiting that does not get better. ? Sudden, fast weight gain. ? Sudden swelling in your hands, ankles, or face. ? Bleeding from your vagina. ? Blood in your pee. ? Blurry vision. ? Double vision. ? Shortness of breath. ? Chest pain. ? Weakness on one side of your body. ? Trouble talking.  Your baby is not moving as much as usual. Summary  High blood pressure is also called hypertension.  High blood pressure means that the force of your blood moving in your body is too strong.  High blood pressure can cause problems for you and your baby.  Keep all follow-up visits as told by your doctor. This is important. This information is not intended to replace advice given to you by your health care provider. Make sure you discuss any questions you have with your health care provider. Document Released: 12/20/2010 Document Revised: 03/10/2019 Document Reviewed: 12/14/2018 Elsevier Patient Education  [employer].   Tension Headache, Adult A tension headache is pain, pressure, or aching in your head. Tension headaches can last from 30 minutes to several days. Follow these instructions at home: Managing pain  Take over-the-counter and prescription medicines only as told by your doctor.  When you have a headache, lie down in a dark, quiet room.  If told, put ice on  your head and neck: ? Put ice in a plastic bag. ? Place a towel between your skin and the bag. ? Leave the ice on for 20 minutes, 2-3 times a day.  If told, put heat on the back of your neck. Do this as often as your doctor tells you to. Use the kind of heat that your doctor recommends, such as a moist heat pack or a heating pad. ? Place a towel between your skin and the heat. ? Leave the heat on for 20-30 minutes. ? Remove the heat if your skin turns bright red. Eating and drinking  Eat meals on a regular schedule.  Watch how much alcohol you drink: ? If you are a woman and are not pregnant, do not drink more than 1 drink a day. ? If you are a man, do not drink more than 2 drinks a day.  Drink enough fluid to keep your pee (urine) pale yellow.  Do not use a lot of caffeine, or stop using caffeine. Lifestyle  Get enough sleep. Get 7-9 hours of sleep each night. Or get the amount of sleep that your doctor tells you to.  At bedtime, remove all electronic devices from your room. Examples of electronic devices are computers, phones, and tablets.  Find ways to lessen your stress. Some things that can lessen stress are: ? Exercise. ? Deep breathing. ? Yoga. ? Music. ? Positive thoughts.  Sit up straight. Do not tighten (tense) your muscles.  Do not use any products that have nicotine or tobacco in them, such as cigarettes and e-cigarettes. If you need help quitting, ask your doctor. General instructions   Keep all follow-up visits as told by your doctor. This is important.  Avoid things that can bring on headaches. Keep a journal to find out if certain things bring on headaches. For example, write down: ? What you eat and drink. ? How much sleep you get. ? Any change to your diet or medicines. Contact a doctor if:  Your headache does not get better.  Your headache comes back.  You have a headache and sounds, light, or smells bother you.  You feel sick to your stomach  (nauseous) or you throw up (vomit).  Your stomach hurts. Get help right away if:  You suddenly get a very bad headache along with any of these: ? A stiff neck. ? Feeling sick to your stomach. ? Throwing up. ? Feeling weak. ? Trouble seeing. ? Feeling short of breath. ? A rash. ? Feeling unusually sleepy. ? Trouble speaking. ? Pain in your eye or ear. ? Trouble walking or balancing. ? Feeling like you will pass out (faint). ? Passing out. Summary  A tension headache is pain, pressure, or aching in your head.  Tension headaches can last from 30 minutes to several days.  Lifestyle changes and medicines may help relieve pain. This information is not intended to replace advice given to you by your health care provider. Make sure you discuss any questions you have with your health care provider. Document Released: 02/11/2010 Document Revised: 10/30/2017 Document Reviewed: 02/27/2017 Elsevier Patient Education  2020 [employer].

## 2019-08-19 NOTE — MAU Note (Signed)
.   Donna Marquez is a 28 y.o. at [redacted]w[redacted]d here in MAU after office visit for an increase in blood pressure. PT reports pelvic pressure Denies any other issues with pregnancy  Onset of complaint: today Pain score: 4 Vitals:   08/19/19 1656  BP: 131/70  Pulse: 92  Resp: 16  Temp: 98.4 F (36.9 C)     FHT:158 Lab orders placed from triage: UA

## 2019-08-19 NOTE — MAU Note (Signed)
Pt sent from office for BP evaluation. Reports a HA that started a few hours ago, however has not taken anything for pain. Rates 4/10. Reports that she has seen intermittent sparkles/flashes of light, but has not had any today. Denies contractions, LOF, or vaginal bleeding. Reports good fetal movement.

## 2019-08-22 DIAGNOSIS — R7309 Other abnormal glucose: Secondary | ICD-10-CM | POA: Insufficient documentation

## 2019-08-22 LAB — OB RESULTS CONSOLE HIV ANTIBODY (ROUTINE TESTING): HIV: NONREACTIVE

## 2019-08-31 DIAGNOSIS — R509 Fever, unspecified: Secondary | ICD-10-CM | POA: Diagnosis not present

## 2019-08-31 DIAGNOSIS — R05 Cough: Secondary | ICD-10-CM | POA: Diagnosis not present

## 2019-08-31 DIAGNOSIS — J069 Acute upper respiratory infection, unspecified: Secondary | ICD-10-CM | POA: Diagnosis not present

## 2019-08-31 DIAGNOSIS — Z3A30 30 weeks gestation of pregnancy: Secondary | ICD-10-CM | POA: Diagnosis not present

## 2019-09-01 DIAGNOSIS — Z331 Pregnant state, incidental: Secondary | ICD-10-CM | POA: Diagnosis not present

## 2019-09-01 DIAGNOSIS — R067 Sneezing: Secondary | ICD-10-CM | POA: Diagnosis not present

## 2019-09-01 DIAGNOSIS — Z20828 Contact with and (suspected) exposure to other viral communicable diseases: Secondary | ICD-10-CM | POA: Diagnosis not present

## 2019-09-01 DIAGNOSIS — R05 Cough: Secondary | ICD-10-CM | POA: Diagnosis not present

## 2019-09-19 DIAGNOSIS — O4443 Low lying placenta NOS or without hemorrhage, third trimester: Secondary | ICD-10-CM | POA: Diagnosis not present

## 2019-09-19 DIAGNOSIS — O368130 Decreased fetal movements, third trimester, not applicable or unspecified: Secondary | ICD-10-CM | POA: Diagnosis not present

## 2019-09-19 DIAGNOSIS — O365930 Maternal care for other known or suspected poor fetal growth, third trimester, not applicable or unspecified: Secondary | ICD-10-CM | POA: Diagnosis not present

## 2019-09-19 DIAGNOSIS — Z3A32 32 weeks gestation of pregnancy: Secondary | ICD-10-CM | POA: Diagnosis not present

## 2019-09-22 DIAGNOSIS — O368139 Decreased fetal movements, third trimester, other fetus: Secondary | ICD-10-CM | POA: Diagnosis not present

## 2019-09-22 DIAGNOSIS — Z3A32 32 weeks gestation of pregnancy: Secondary | ICD-10-CM | POA: Diagnosis not present

## 2019-09-23 ENCOUNTER — Inpatient Hospital Stay (HOSPITAL_COMMUNITY)
Admission: AD | Admit: 2019-09-23 | Discharge: 2019-09-26 | DRG: 833 | Disposition: A | Discharge status: Home / Self Care | Payer: BC Managed Care – PPO | Attending: Obstetrics and Gynecology | Admitting: Obstetrics and Gynecology

## 2019-09-23 ENCOUNTER — Other Ambulatory Visit: Payer: Self-pay

## 2019-09-23 ENCOUNTER — Encounter (HOSPITAL_COMMUNITY): Payer: Self-pay | Admitting: *Deleted

## 2019-09-23 DIAGNOSIS — G43909 Migraine, unspecified, not intractable, without status migrainosus: Secondary | ICD-10-CM | POA: Diagnosis not present

## 2019-09-23 DIAGNOSIS — R519 Headache, unspecified: Secondary | ICD-10-CM

## 2019-09-23 DIAGNOSIS — K219 Gastro-esophageal reflux disease without esophagitis: Secondary | ICD-10-CM | POA: Diagnosis present

## 2019-09-23 DIAGNOSIS — O99613 Diseases of the digestive system complicating pregnancy, third trimester: Secondary | ICD-10-CM | POA: Diagnosis not present

## 2019-09-23 DIAGNOSIS — O4703 False labor before 37 completed weeks of gestation, third trimester: Secondary | ICD-10-CM | POA: Diagnosis present

## 2019-09-23 DIAGNOSIS — O26893 Other specified pregnancy related conditions, third trimester: Secondary | ICD-10-CM | POA: Diagnosis not present

## 2019-09-23 DIAGNOSIS — Z20828 Contact with and (suspected) exposure to other viral communicable diseases: Secondary | ICD-10-CM | POA: Diagnosis not present

## 2019-09-23 DIAGNOSIS — O26891 Other specified pregnancy related conditions, first trimester: Secondary | ICD-10-CM | POA: Diagnosis not present

## 2019-09-23 DIAGNOSIS — O24410 Gestational diabetes mellitus in pregnancy, diet controlled: Secondary | ICD-10-CM | POA: Diagnosis present

## 2019-09-23 DIAGNOSIS — Z3A32 32 weeks gestation of pregnancy: Secondary | ICD-10-CM | POA: Diagnosis not present

## 2019-09-23 DIAGNOSIS — O99843 Bariatric surgery status complicating pregnancy, third trimester: Secondary | ICD-10-CM | POA: Diagnosis not present

## 2019-09-23 LAB — URINALYSIS, ROUTINE W REFLEX MICROSCOPIC
Bilirubin Urine: NEGATIVE
Glucose, UA: 150 mg/dL — AB
Hgb urine dipstick: NEGATIVE
Ketones, ur: NEGATIVE mg/dL
Leukocytes,Ua: NEGATIVE
Nitrite: NEGATIVE
Protein, ur: NEGATIVE mg/dL
Specific Gravity, Urine: 1.019 (ref 1.005–1.030)
pH: 6 (ref 5.0–8.0)

## 2019-09-23 LAB — CBC
HCT: 33.9 % — ABNORMAL LOW (ref 36.0–46.0)
Hemoglobin: 11.5 g/dL — ABNORMAL LOW (ref 12.0–15.0)
MCH: 29.6 pg (ref 26.0–34.0)
MCHC: 33.9 g/dL (ref 30.0–36.0)
MCV: 87.4 fL (ref 80.0–100.0)
Platelets: 316 10*3/uL (ref 150–400)
RBC: 3.88 MIL/uL (ref 3.87–5.11)
RDW: 13.2 % (ref 11.5–15.5)
WBC: 11.4 10*3/uL — ABNORMAL HIGH (ref 4.0–10.5)
nRBC: 0 % (ref 0.0–0.2)

## 2019-09-23 LAB — WET PREP, GENITAL
Clue Cells Wet Prep HPF POC: NONE SEEN
Sperm: NONE SEEN
Trich, Wet Prep: NONE SEEN
Yeast Wet Prep HPF POC: NONE SEEN

## 2019-09-23 LAB — COMPREHENSIVE METABOLIC PANEL
ALT: 15 U/L (ref 0–44)
AST: 16 U/L (ref 15–41)
Albumin: 2.8 g/dL — ABNORMAL LOW (ref 3.5–5.0)
Alkaline Phosphatase: 111 U/L (ref 38–126)
Anion gap: 12 (ref 5–15)
BUN: 9 mg/dL (ref 6–20)
CO2: 21 mmol/L — ABNORMAL LOW (ref 22–32)
Calcium: 8.8 mg/dL — ABNORMAL LOW (ref 8.9–10.3)
Chloride: 104 mmol/L (ref 98–111)
Creatinine, Ser: 0.58 mg/dL (ref 0.44–1.00)
GFR calc Af Amer: >60 mL/min (ref >60)
GFR calc non Af Amer: >60 mL/min (ref >60)
Glucose, Bld: 84 mg/dL (ref 70–99)
Potassium: 4.1 mmol/L (ref 3.5–5.1)
Sodium: 137 mmol/L (ref 135–145)
Total Bilirubin: <0.1 mg/dL — ABNORMAL LOW (ref 0.3–1.2)
Total Protein: 6.5 g/dL (ref 6.5–8.1)

## 2019-09-23 LAB — PROTEIN / CREATININE RATIO, URINE
Creatinine, Urine: 116.28 mg/dL
Protein Creatinine Ratio: 0.1 mg/mg{Cre} (ref 0.00–0.15)
Total Protein, Urine: 12 mg/dL

## 2019-09-23 LAB — FETAL FIBRONECTIN: Fetal Fibronectin: POSITIVE — AB

## 2019-09-23 MED ORDER — DIPHENHYDRAMINE HCL 50 MG/ML IJ SOLN
25.0000 mg | Freq: Once | INTRAMUSCULAR | Status: AC
  Administered 2019-09-23: 25 mg via INTRAVENOUS
  Filled 2019-09-23: qty 1

## 2019-09-23 MED ORDER — ACETAMINOPHEN 325 MG PO TABS: 650.0000 mg | ORAL_TABLET | ORAL | Status: DC | PRN

## 2019-09-23 MED ORDER — BETAMETHASONE SOD PHOS & ACET 6 (3-3) MG/ML IJ SUSP
12.0000 mg | Freq: Once | INTRAMUSCULAR | Status: AC
  Administered 2019-09-23: 12 mg via INTRAMUSCULAR
  Filled 2019-09-23: qty 5

## 2019-09-23 MED ORDER — NIFEDIPINE 10 MG PO CAPS
10.0000 mg | ORAL_CAPSULE | ORAL | Status: AC
  Administered 2019-09-23 (×2): 10 mg via ORAL
  Filled 2019-09-23 (×3): qty 1

## 2019-09-23 MED ORDER — BETAMETHASONE SOD PHOS & ACET 6 (3-3) MG/ML IJ SUSP
12.0000 mg | Freq: Once | INTRAMUSCULAR | Status: AC
  Administered 2019-09-24: 12 mg via INTRAMUSCULAR

## 2019-09-23 MED ORDER — DEXAMETHASONE SODIUM PHOSPHATE 10 MG/ML IJ SOLN
10.0000 mg | Freq: Once | INTRAMUSCULAR | Status: AC
  Administered 2019-09-23: 10 mg via INTRAVENOUS
  Filled 2019-09-23 (×2): qty 1

## 2019-09-23 MED ORDER — METOCLOPRAMIDE HCL 5 MG/ML IJ SOLN
10.0000 mg | Freq: Once | INTRAMUSCULAR | Status: AC
  Administered 2019-09-23: 10 mg via INTRAVENOUS
  Filled 2019-09-23: qty 2

## 2019-09-23 MED ORDER — LACTATED RINGERS IV BOLUS
1000.0000 mL | Freq: Once | INTRAVENOUS | Status: AC
  Administered 2019-09-23: 1000 mL via INTRAVENOUS

## 2019-09-23 MED ORDER — CALCIUM CARBONATE ANTACID 500 MG PO CHEW: 2.0000 | CHEWABLE_TABLET | ORAL | Status: DC | PRN

## 2019-09-23 MED ORDER — MAGNESIUM SULFATE BOLUS VIA INFUSION
6.0000 g | Freq: Once | INTRAVENOUS | Status: AC
  Administered 2019-09-23: 6 g via INTRAVENOUS
  Filled 2019-09-23: qty 1000

## 2019-09-23 MED ORDER — DOCUSATE SODIUM 100 MG PO CAPS
100.0000 mg | ORAL_CAPSULE | Freq: Every day | ORAL | Status: DC
  Administered 2019-09-24: 100 mg via ORAL
  Filled 2019-09-23: qty 1

## 2019-09-23 MED ORDER — ZOLPIDEM TARTRATE 5 MG PO TABS
5.0000 mg | ORAL_TABLET | Freq: Every evening | ORAL | Status: DC | PRN
  Administered 2019-09-24 – 2019-09-25 (×2): 5 mg via ORAL
  Filled 2019-09-23 (×2): qty 1

## 2019-09-23 MED ORDER — MAGNESIUM SULFATE 40 GM/1000ML IV SOLN
2.0000 g/h | INTRAVENOUS | Status: AC
  Administered 2019-09-23 – 2019-09-24 (×2): 2 g/h via INTRAVENOUS
  Filled 2019-09-23 (×2): qty 1000

## 2019-09-23 MED ORDER — PRENATAL MULTIVITAMIN CH
1.0000 | ORAL_TABLET | Freq: Every day | ORAL | Status: DC
  Administered 2019-09-24: 1 via ORAL
  Filled 2019-09-23: qty 1

## 2019-09-23 MED ORDER — LACTATED RINGERS IV SOLN
INTRAVENOUS | Status: DC
  Administered 2019-09-23 – 2019-09-24 (×2): via INTRAVENOUS

## 2019-09-23 NOTE — MAU Note (Signed)
Patient states she has had a headache for 2 days and was seen by Dr Melba Coon in the office yesterday and given a Rx for Fioricet.  She has taken it 4x and will fall asleep, then wake up with pain again.  Had 1 elevated BP in the office prior to the HA and yesterday her BP was in the 130s/80s.

## 2019-09-23 NOTE — Progress Notes (Signed)
Patient ID: Donna Marquez, female   DOB: March 22, 1991, 28 y.o.   MRN: [id number] Pt reports headache still present but improved She reports contractions getting stronger in intensity.  VSS SVE - 3/70/-2 anterior,soft EFM - cat 1 120   A/P: Prime at 18 4/7wks in preterm labor         Transfer to L/D- admit         Start MgSo4 6g bolus then 2gm/hr         S/p first dose BMZ at 2200; next tomorrow         NPO         Pending rapid GBS- treat if further cervical change noted         Neonatologist notified and consult requested         Answered pt and husband questions

## 2019-09-23 NOTE — MAU Provider Note (Addendum)
History     CSN: [id number]  Arrival date and time: 09/23/19 1733   First Provider Initiated Contact with Patient 09/23/19 1834      Chief Complaint  Patient presents with  . Headache   27 y.o. G1 @32 .4 wks presenting with HA. Reports onset 2 days ago. HA is allover but worse in occipital region. Rates 8/10. She was seen in the office yesterday and given Fioricet. She has tried this but it only makes her sleep. When she awakes the HA returns. Reports intermittent floaters. No blurry vision. No aura. Had 1 episode of N/V yesterday. No hx of HTN but had 1 elevated BP at 27 wks. No pregnancy c/o. Reports good FM.   OB History    Gravida  1   Para      Term      Preterm      AB      Living        SAB      TAB      Ectopic      Multiple      Live Births              Past Medical History:  Diagnosis Date  . Allergic rhinitis   . Endometriosis   . History of concussion 2017 approx.   per pt no residual  . History of mononucleosis   . Hypochromic microcytic anemia   . PCOS (polycystic ovarian syndrome)   . Pelvic pain   . S/P gastric bypass 10/27/2017   gastric sleeve  . Wears glasses     Past Surgical History:  Procedure Laterality Date  . ESOPHAGOGASTRODUODENOSCOPY (EGD) WITH PROPOFOL  09-16-2017   @[facility]   . FULGURATION OF LESION N/A 01/07/2019   Procedure: FULGURATION OF ENDOMETRIOSIS;  Surgeon: , MD;  Location: [facility];  Service: Gynecology;  Laterality: N/A;  . LAPAROSCOPIC GASTRIC SLEEVE RESECTION  10-27-2017    @[facility]   . LAPAROSCOPIC LYSIS OF ADHESIONS  01/07/2019   Procedure: LAPAROSCOPIC LYSIS OF ADHESIONS;  Surgeon: 10-29-2017, MD;  Location: [facility];  Service: Gynecology;;  . LAPAROSCOPY N/A 01/07/2019   Procedure: LAPAROSCOPY DIAGNOSTIC;  Surgeon: 03/08/2019, MD;  Location: [facility];  Service: Gynecology;  Laterality: N/A;  . WISDOM TOOTH EXTRACTION       History reviewed. No pertinent family history.  Social History   Tobacco Use  . Smoking status: Never Smoker  . Smokeless tobacco: Never Used  Substance Use Topics  . Alcohol use: Yes    Comment: occasional  . Drug use: Never    Allergies:  Allergies  Allergen Reactions  . Ciprofloxacin Anaphylaxis, Shortness Of Breath and Swelling  . Morphine Nausea And Vomiting    Nausea and vomitting  . Morphine And Related Nausea And Vomiting    Medications Prior to Admission  Medication Sig Dispense Refill Last Dose  . butalbital-acetaminophen-caffeine (FIORICET) 50-325-40 MG tablet Take 1 tablet by mouth every 4 (four) hours as needed for headache.   09/23/2019 at 1400  . calcium carbonate (TUMS - DOSED IN MG ELEMENTAL CALCIUM) 500 MG chewable tablet Chew 2 tablets by mouth daily.   Past Week at Unknown time  . ferrous sulfate 325 (65 FE) MG tablet Take 325 mg by mouth daily with breakfast.   09/22/2019 at Unknown time  . Prenatal Vit-Fe Fumarate-FA (PRENATAL MULTIVITAMIN) TABS tablet Take 1 tablet by mouth daily at 12 noon.   09/23/2019 at Unknown time  . acetaminophen (  TYLENOL) 500 MG tablet Take 1,000 mg by mouth every 6 (six) hours as needed.     . famotidine (PEPCID) 20 MG tablet Take 1 tablet (20 mg total) by mouth daily. 90 tablet 4 More than a month at Unknown time    Review of Systems  Eyes: Positive for visual disturbance.  Gastrointestinal: Negative for abdominal pain.  Genitourinary: Negative for vaginal bleeding.  Neurological: Positive for dizziness and headaches.   Physical Exam   Blood pressure 116/67, pulse 77, temperature 98.1 F (36.7 C), resp. rate 16, weight 109.8 kg, last menstrual period 12/29/2018, SpO2 99 %. Patient Vitals for the past 24 hrs:  BP Temp Pulse Resp SpO2 Weight  09/23/19 1845 116/67 - 77 - 99 % -  09/23/19 1830 110/62 - 79 - 96 % -  09/23/19 1815 112/66 - 85 - 96 % -  09/23/19 1810 119/67 98.1 F (36.7 C) 95 16 - -  09/23/19 1803 - -  - - - 109.8 kg   Physical Exam  Nursing note and vitals reviewed. Constitutional: She is oriented to person, place, and time. She appears well-developed and well-nourished. No distress.  HENT:  Head: Normocephalic and atraumatic.  Neck: Normal range of motion.  Cardiovascular: Normal rate.  Respiratory: Effort normal. No respiratory distress.  Musculoskeletal: Normal range of motion.  Neurological: She is alert and oriented to person, place, and time.  Skin: Skin is warm and dry.  Psychiatric: She has a normal mood and affect.  EFM: 135 bpm, mod variability, + accels, no decels Toco: irritability  Results for orders placed or performed during the hospital encounter of 09/23/19 (from the past 24 hour(s))  Urinalysis, Routine w reflex microscopic     Status: Abnormal   Collection Time: 09/23/19  6:39 PM  Result Value Ref Range   Color, Urine YELLOW YELLOW   APPearance CLEAR CLEAR   Specific Gravity, Urine 1.019 1.005 - 1.030   pH 6.0 5.0 - 8.0   Glucose, UA 150 (A) NEGATIVE mg/dL   Hgb urine dipstick NEGATIVE NEGATIVE   Bilirubin Urine NEGATIVE NEGATIVE   Ketones, ur NEGATIVE NEGATIVE mg/dL   Protein, ur NEGATIVE NEGATIVE mg/dL   Nitrite NEGATIVE NEGATIVE   Leukocytes,Ua NEGATIVE NEGATIVE  Comprehensive metabolic panel     Status: Abnormal   Collection Time: 09/23/19  6:42 PM  Result Value Ref Range   Sodium 137 135 - 145 mmol/L   Potassium 4.1 3.5 - 5.1 mmol/L   Chloride 104 98 - 111 mmol/L   CO2 21 (L) 22 - 32 mmol/L   Glucose, Bld 84 70 - 99 mg/dL   BUN 9 6 - 20 mg/dL   Creatinine, Ser 0.58 0.44 - 1.00 mg/dL   Calcium 8.8 (L) 8.9 - 10.3 mg/dL   Total Protein 6.5 6.5 - 8.1 g/dL   Albumin 2.8 (L) 3.5 - 5.0 g/dL   AST 16 15 - 41 U/L   ALT 15 0 - 44 U/L   Alkaline Phosphatase 111 38 - 126 U/L   Total Bilirubin <0.1 (L) 0.3 - 1.2 mg/dL   GFR calc non Af Amer >60 >60 mL/min   GFR calc Af Amer >60 >60 mL/min   Anion gap 12 5 - 15  CBC     Status: Abnormal   Collection  Time: 09/23/19  6:42 PM  Result Value Ref Range   WBC 11.4 (H) 4.0 - 10.5 K/uL   RBC 3.88 3.87 - 5.11 MIL/uL   Hemoglobin 11.5 (L) 12.0 - 15.0  g/dL   HCT 16.133.9 (L) 09.636.0 - 04.546.0 %   MCV 87.4 80.0 - 100.0 fL   MCH 29.6 26.0 - 34.0 pg   MCHC 33.9 30.0 - 36.0 g/dL   RDW 40.913.2 81.111.5 - 91.415.5 %   Platelets 316 150 - 400 K/uL   nRBC 0.0 0.0 - 0.2 %  Protein / creatinine ratio, urine     Status: None   Collection Time: 09/23/19  6:42 PM  Result Value Ref Range   Creatinine, Urine 116.28 mg/dL   Total Protein, Urine 12 mg/dL   Protein Creatinine Ratio 0.10 0.00 - 0.15 mg/mg[Cre]   MAU Course  Procedures Meds ordered this encounter  Medications  . lactated ringers bolus 1,000 mL  . dexamethasone (DECADRON) injection 10 mg  . diphenhydrAMINE (BENADRYL) injection 25 mg  . metoCLOPramide (REGLAN) injection 10 mg  . NIFEdipine (PROCARDIA) capsule 10 mg  . lactated ringers bolus 1,000 mL   MDM Labs ordered and reviewed.  2028: Pt reports more frequent BH ctx. Ctx q2-6 on toco. VE: 1/60/ballotable. Will order Procardia and send FFN. Transfer of care given to [place], CNM Bhambri, Melanie, [place]  09/23/2019 9:09 PM    Assessment and Plan  Reassessment (9:36 PM) fFN Positive  -Results as above -BMZ ordered -Patient continues to have contractions and rates intermittent back pain 5/10. -Dr. Mindi SlickerBanga contacted and informed of recommendation for admit for observation. *Agrees and requests provider have nurse collect wet prep, gc/ct, and GBS (rapid). -Provider to place standard admission orders and requests as above. -Patient notified of plan for observation.  Questions/concerns addressed.  Informed that care will now be managed by primary ob. -Collect Covid per protocol and transfer to Antenatal.   Cherre RobinsJessica L Reghan Thul MSN, CNM Advanced Practice Provider, [facility]

## 2019-09-23 NOTE — Plan of Care (Signed)
  Problem: Education: Goal: Knowledge of disease or condition will improve Outcome: Progressing   

## 2019-09-23 NOTE — H&P (Addendum)
Donna Marquez is a 28 y.o.G1P0 female presenting at 20 4/[redacted]wks gestation with complaint of migraine headache unresolved with fiorcet . Pt reports took 4 doses of rx given her in office yesterday - able to fall asleep but headaches resumes when wakes up. Pt received a headache cocktail in MAU when preecclampsia labs were noted to be negative. At this time she begun complaining of unrelenting back pain and was noted to be having preterm contractions. A fetal fibronectin was positive and cervix noted to be 1cm dilated. Pt is dated per a 7 week [place]. She has had GERD, anemia, insulin resistance ( abnormal 1hr ; opted to check FSBS rather than 3hr - good control on diet) along with migraines. Pt is obese; s/p gastric sleeve.   OB History    Gravida  1   Para      Term      Preterm      AB      Living        SAB      TAB      Ectopic      Multiple      Live Births             Past Medical History:  Diagnosis Date  . Allergic rhinitis   . Endometriosis   . History of concussion 2017 approx.   per pt no residual  . History of mononucleosis   . Hypochromic microcytic anemia   . PCOS (polycystic ovarian syndrome)   . Pelvic pain   . S/P gastric bypass 10/27/2017   gastric sleeve  . Wears glasses    Past Surgical History:  Procedure Laterality Date  . ESOPHAGOGASTRODUODENOSCOPY (EGD) WITH PROPOFOL  09-16-2017   @[facility]   . FULGURATION OF LESION N/A 01/07/2019   Procedure: FULGURATION OF ENDOMETRIOSIS;  Surgeon: Janyth Contes, MD;  Location: [place];  Service: Gynecology;  Laterality: N/A;  . LAPAROSCOPIC GASTRIC SLEEVE RESECTION  10-27-2017    @[facility]   . LAPAROSCOPIC LYSIS OF ADHESIONS  01/07/2019   Procedure: LAPAROSCOPIC LYSIS OF ADHESIONS;  Surgeon: Janyth Contes, MD;  Location: [place];  Service: Gynecology;;  . LAPAROSCOPY N/A 01/07/2019   Procedure: LAPAROSCOPY DIAGNOSTIC;  Surgeon: Janyth Contes, MD;  Location: [place];  Service: Gynecology;  Laterality: N/A;  . WISDOM TOOTH EXTRACTION     Family History: family history is not on file. Social History:  reports that she has never smoked. She has never used smokeless tobacco. She reports current alcohol use. She reports that she does not use drugs.     Maternal Diabetes: Yes:  Diabetes Type:  Diet controlled Genetic Screening: Normal Maternal Ultrasounds/Referrals: Normal Fetal Ultrasounds or other Referrals:  None Maternal Substance Abuse:  No Significant Maternal Medications:  None Significant Maternal Lab Results:  Other: GBS unknown Other Comments:  None  Review of Systems  Constitutional: Positive for malaise/fatigue. Negative for chills, fever and weight loss.  Eyes: Positive for double vision. Negative for blurred vision.  Respiratory: Negative for shortness of breath.   Cardiovascular: Negative for chest pain.  Gastrointestinal: Positive for abdominal pain. Negative for heartburn, nausea and vomiting.  Genitourinary: Negative for dysuria.  Musculoskeletal: Positive for back pain and myalgias.  Skin: Negative for itching and rash.  Neurological: Positive for headaches. Negative for dizziness.  Endo/Heme/Allergies: Does not bruise/bleed easily.  Psychiatric/Behavioral: Negative for depression, hallucinations, substance abuse and suicidal ideas. The patient is nervous/anxious.    Maternal Medical History:  Reason for  admission: Contractions.  Nausea.  Contractions: Onset was 1-2 hours ago.   Frequency: irregular.   Perceived severity is mild.    Fetal activity: Perceived fetal activity is normal.   Last perceived fetal movement was within the past hour.    Prenatal complications: Preterm labor.   Prenatal Complications - Diabetes: gestational. Diabetes is managed by diet.      Dilation: 1 Effacement (%): 60 Exam by:: Donette Larry CNM  Blood pressure (!) 114/59, pulse 94, temperature 98.1 F (36.7 C),  resp. rate 16, weight 109.8 kg, last menstrual period 12/29/2018, SpO2 99 %. Maternal Exam:  Uterine Assessment: Contraction strength is moderate.  Contraction frequency is irregular.   Abdomen: Patient reports generalized tenderness.  Estimated fetal weight is AGA.    Introitus: Normal vulva. Vulva is negative for condylomata and lesion.  Normal vagina.  Vagina is negative for condylomata.  Pelvis: adequate for delivery.   Cervix: Cervix evaluated by digital exam.     Fetal Exam Fetal Monitor Review: Baseline rate: 140.  Variability: moderate (6-25 bpm).   Pattern: accelerations present and no decelerations.    Fetal State Assessment: Category I - tracings are normal.     Physical Exam  Constitutional: She appears well-developed.  Neck: Normal range of motion.  Cardiovascular: Normal rate.  Respiratory: Effort normal.  GI: There is generalized abdominal tenderness.  Genitourinary:    Vulva, vagina and uterus normal.     No vulval condylomata or lesion noted.   Musculoskeletal: Normal range of motion.  Neurological: She is alert.  Skin: Skin is warm.  Psychiatric: She has a normal mood and affect. Her behavior is normal. Judgment and thought content normal.    Prenatal labs: ABO, Rh:   Antibody:   Rubella:   RPR:    HBsAg:    HIV: Non Reactive (05/22 0037)  GBS:     Assessment/Plan: 27yo G1P0 at 32 4/7wks with preterm contractions concerning for preterm labor Admit for observation and BMZ administration [place] with procardia, ivf;  MgSO4 if indicated Rapid GBS, swab for BV, yeast, gc/chl done  NST TID and prn contractions U/S for position now Cathrine Muster 09/23/2019, 10:05 PM

## 2019-09-24 DIAGNOSIS — Z3A32 32 weeks gestation of pregnancy: Secondary | ICD-10-CM | POA: Diagnosis not present

## 2019-09-24 LAB — MAGNESIUM: Magnesium: 4.8 mg/dL — ABNORMAL HIGH (ref 1.7–2.4)

## 2019-09-24 LAB — ABO/RH: ABO/RH(D): A POS

## 2019-09-24 LAB — GLUCOSE, CAPILLARY
Glucose-Capillary: 103 mg/dL — ABNORMAL HIGH (ref 70–99)
Glucose-Capillary: 118 mg/dL — ABNORMAL HIGH (ref 70–99)
Glucose-Capillary: 118 mg/dL — ABNORMAL HIGH (ref 70–99)

## 2019-09-24 LAB — TYPE AND SCREEN
ABO/RH(D): A POS
Antibody Screen: NEGATIVE

## 2019-09-24 LAB — SARS CORONAVIRUS 2 BY RT PCR (HOSPITAL ORDER, PERFORMED IN ~~LOC~~ HOSPITAL LAB): SARS Coronavirus 2: NEGATIVE

## 2019-09-24 MED ORDER — DEXAMETHASONE SODIUM PHOSPHATE 10 MG/ML IJ SOLN
10.0000 mg | Freq: Once | INTRAMUSCULAR | Status: AC
  Administered 2019-09-24: 10 mg via INTRAVENOUS
  Filled 2019-09-24: qty 1

## 2019-09-24 MED ORDER — METOCLOPRAMIDE HCL 5 MG/ML IJ SOLN
10.0000 mg | Freq: Once | INTRAMUSCULAR | Status: AC
  Administered 2019-09-24: 10 mg via INTRAVENOUS
  Filled 2019-09-24: qty 2

## 2019-09-24 MED ORDER — BUTALBITAL-APAP-CAFFEINE 50-325-40 MG PO TABS
1.0000 | ORAL_TABLET | ORAL | Status: DC | PRN
  Administered 2019-09-24: 1 via ORAL
  Filled 2019-09-24: qty 1

## 2019-09-24 MED ORDER — TERBUTALINE SULFATE 1 MG/ML IJ SOLN
0.2500 mg | Freq: Once | INTRAMUSCULAR | Status: AC
  Administered 2019-09-24: 0.25 mg via SUBCUTANEOUS
  Filled 2019-09-24: qty 1

## 2019-09-24 MED ORDER — DIPHENHYDRAMINE HCL 50 MG/ML IJ SOLN
25.0000 mg | Freq: Once | INTRAMUSCULAR | Status: AC
  Administered 2019-09-24: 25 mg via INTRAVENOUS
  Filled 2019-09-24: qty 1

## 2019-09-24 NOTE — Progress Notes (Signed)
Patient ID: Donna Marquez, female   DOB: Apr 01, 1991, 28 y.o.   MRN: [id number] Pt reports continued HA - worsening despite cold pack to back of neck where pain mostly is. She denies any blurry vision, nausea or dizziness.  VS- 118-122/71, 92 GEN - NAD EFM - 120, moderate variability TOCO - ctxs q 35mins SVE - deferred  A/P: Prime at 30 5/7wks with preterm labor and HA          - HA cocktail ( decadron, reglan, benadryl 0 x 1 now          -Mg level           -If HA persists order neurology consult for atypical HA  Continue on MgSO4 at this time - ends at 11pm

## 2019-09-24 NOTE — Progress Notes (Signed)
Patient ID: Donna Marquez, female   DOB: [birth year], 28 y.o.   MRN: [id number] Pt doing well. Reports appreciates rare contraction - was able to sleep two hours last night with ambien. +Fms Appreciative of NICU consul, states was "very helpful"  VSS GEN - NAD EFM - 125, cat 1 TOCO - ctxs q 65mins SVE - deferred  Wet prep - wnl GBS pending  A/P: Prime at 32 5/7wks in latent labor now          - Continue on MgSO4 till 11pm tonight         - Second dose BMZ due 11pm tonight         - May eat today         - Continue current mgmt

## 2019-09-24 NOTE — Consult Note (Signed)
[facility]  Prenatal Consult       09/24/2019  2:10 AM   I was asked by Dr. Terri Piedra to consult on this patient for possible preterm delivery. I had the pleasure of meeting with [name] and her partner today. She is a 28 year old G1P0 at [redacted]w[redacted]d today. She presented with HA and had negative pre-eclampsia labs and has had normal blood pressures. Since being evaluated, she has started to have contractions and cervical change concerning for preterm labor. She has received one dose of betamethasone thus far. They are expecting a baby boy. We discussed the high rate of survival at this gestational age.  I explained that the neonatal intensive care team would be present for the delivery and outlined the likely delivery room course for this baby including routine resuscitation and NRP-guided approaches to the treatment of respiratory distress. We discussed other common problems associated with prematurity including respiratory distress syndrome and feeding issues. We briefly discussed IVH/PVL, ROP, and NEC and that these are complications associated with prematurity, but that by 30 weeks are uncommon. We briefly discussed neurodevelopmental outcomes and routine developmental surveillance by baby's pediatrician.  We discussed the average length of stay but I noted that the actual LOS would depend on the severity of problems encountered and response to treatments. We discussed visitation policies and the resources available while their child is in the hospital.  We discussed the importance of good nutrition and various methods of providing nutrition (parenteral hyperalimentation, gavage feedings and/or oral feeding). We discussed the benefits of human milk. Mother intends to breast feed and I encouraged pumping soon after birth and outlined resources that are available to support breast feeding. We discussed the possibility of using donor breast milk as a bridge, and parents are  amenable to this option.  Thank you for involving [place] in the care of this patient. A member of our team will be available should the family have additional questions. I spent 20 minutes face-to-face with [name] and her partner in counseling them about prematurity.  Renato Shin, MD Neonatal Medicine

## 2019-09-25 DIAGNOSIS — Z3A32 32 weeks gestation of pregnancy: Secondary | ICD-10-CM | POA: Diagnosis not present

## 2019-09-25 LAB — CULTURE, OB URINE: Culture: NO GROWTH

## 2019-09-25 LAB — GLUCOSE, CAPILLARY
Glucose-Capillary: 126 mg/dL — ABNORMAL HIGH (ref 70–99)
Glucose-Capillary: 104 mg/dL — ABNORMAL HIGH (ref 70–99)
Glucose-Capillary: 102 mg/dL — ABNORMAL HIGH (ref 70–99)
Glucose-Capillary: 128 mg/dL — ABNORMAL HIGH (ref 70–99)
Glucose-Capillary: 104 mg/dL — ABNORMAL HIGH (ref 70–99)

## 2019-09-25 MED ORDER — NIFEDIPINE 10 MG PO CAPS
10.0000 mg | ORAL_CAPSULE | Freq: Four times a day (QID) | ORAL | Status: DC
  Administered 2019-09-25 – 2019-09-26 (×4): 10 mg via ORAL
  Filled 2019-09-25 (×4): qty 1

## 2019-09-25 MED ORDER — CALCIUM CARBONATE ANTACID 500 MG PO CHEW
2.0000 | CHEWABLE_TABLET | ORAL | Status: DC | PRN
  Administered 2019-09-26: 400 mg via ORAL
  Filled 2019-09-25: qty 2

## 2019-09-25 MED ORDER — ACETAMINOPHEN 325 MG PO TABS: 650.0000 mg | ORAL_TABLET | ORAL | Status: DC | PRN

## 2019-09-25 MED ORDER — ZOLPIDEM TARTRATE 5 MG PO TABS: 5.0000 mg | ORAL_TABLET | Freq: Every evening | ORAL | Status: DC | PRN

## 2019-09-25 MED ORDER — PRENATAL MULTIVITAMIN CH
1.0000 | ORAL_TABLET | Freq: Every day | ORAL | Status: DC
  Administered 2019-09-25 – 2019-09-26 (×2): 1 via ORAL
  Filled 2019-09-25 (×2): qty 1

## 2019-09-25 MED ORDER — DOCUSATE SODIUM 100 MG PO CAPS
100.0000 mg | ORAL_CAPSULE | Freq: Every day | ORAL | Status: DC
  Administered 2019-09-25 – 2019-09-26 (×2): 100 mg via ORAL
  Filled 2019-09-25 (×2): qty 1

## 2019-09-25 NOTE — Progress Notes (Signed)
At this time I am taking over for care of this patient as her nurse Eleanora Neighbor left due to a family emergency. I am aware that she will need her strip reviewed from when the patient was previously monitored when Juliann Pulse was managing the patients care.

## 2019-09-25 NOTE — Progress Notes (Signed)
Saline lock flushed with 10cc NS with ease.

## 2019-09-25 NOTE — Progress Notes (Addendum)
Patient ID: Donna Marquez, female   DOB: 09-28-1991, 28 y.o.   MRN: [id number] Pt doing well this am. Appreciating only some of the contractions and mostly on her left side. No LOF. HA resolved after cocktail and sleep overnight.  VSS GEN - NAD EFM - 120, cat 1 TOCO - ctxs q 4-93mins SVE - unchanged at 3/80/-2, ant  A/P: Prime at 94 6/7wks with preterm labor         - S/P MgSO4 x 24 hrs ; ended 10/25         - S/P BMZ 10/24 and 10/25         - Maintain of procardia 10mg  po q 6hrs          - Transfer to [facility] specialty care unit ; reassess in am         - May eat and shower today

## 2019-09-26 DIAGNOSIS — Z3A32 32 weeks gestation of pregnancy: Secondary | ICD-10-CM | POA: Diagnosis not present

## 2019-09-26 LAB — CULTURE, BETA STREP (GROUP B ONLY)

## 2019-09-26 LAB — GLUCOSE, CAPILLARY
Glucose-Capillary: 104 mg/dL — ABNORMAL HIGH (ref 70–99)
Glucose-Capillary: 110 mg/dL — ABNORMAL HIGH (ref 70–99)
Glucose-Capillary: 77 mg/dL (ref 70–99)
Glucose-Capillary: 90 mg/dL (ref 70–99)

## 2019-09-26 MED ORDER — NIFEDIPINE 10 MG PO CAPS: 10.0000 mg | ORAL_CAPSULE | Freq: Four times a day (QID) | ORAL | Status: DC | PRN

## 2019-09-26 MED ORDER — NIFEDIPINE 10 MG PO CAPS: 10.0000 mg | ORAL_CAPSULE | Freq: Four times a day (QID) | ORAL | 1 refills | Status: DC | PRN

## 2019-09-26 NOTE — Progress Notes (Signed)
Discharge instructions discussed with patient. Reviewed preterm labor and procardia admin. Patient verbalized understanding of discharge instructions. IV removed.

## 2019-09-26 NOTE — Discharge Instructions (Signed)
Call for increasing ctx, leaking fluid or bleeding Keep f/u as scheduled next Monday

## 2019-09-26 NOTE — Progress Notes (Addendum)
HD #4, [redacted]W[redacted]D, preterm ctx Feels better, still feeling some ctx but much less Afeb, VSS FHT-reactive last pm with irreg ctx Fundus NT All CBG normal Ctx are improved, will change procardia to prn, encourage ambulation, monitor this am and if stable can d/c this pm

## 2019-09-27 LAB — GC/CHLAMYDIA PROBE AMP (~~LOC~~) NOT AT ARMC
Chlamydia: NEGATIVE
Comment: NEGATIVE
Comment: NORMAL
Neisseria Gonorrhea: NEGATIVE

## 2019-09-27 NOTE — Discharge Summary (Signed)
Physician Discharge Summary  Patient ID: Donna Marquez MRN: [id number] DOB/AGE: 02-Mar-1991 28 y.o.  Admit date: 09/23/2019 Discharge date: 09/26/2019  Admission Diagnoses:IUP at 32+ weeks, headache, threatened preterm labor Discharge Diagnoses: IUP at 33 weeks, threatened preterm labor Active Problems:   Preterm uterine contractions in third trimester, antepartum   Preterm labor in third trimester   Discharged Condition: good  Hospital Course: Pt evaluated in MAU initially for headache, BP normal.  Found to be having ctx, +FFN, VE 1 cm.  She was admitted by Dr. Terri Piedra, received 24 hours of magnesium sulfate, course of betamethasone, meds for headache.  Magnesium was dc'ed late pm 10-24, then put on Procardia scheduled q 6 hrs.  Cervix progressed initially to 3 cm, but ctx quiesced, she became more comfortable, headache resolved, and around noon on 10-26 she was felt to be stable for discharge.  Consults: NICU   Discharge Exam: Blood pressure 124/66, pulse 84, temperature 98.2 F (36.8 C), temperature source Oral, resp. rate 18, height 5\' 6"  (1.676 m), weight 109.8 kg, last menstrual period 12/29/2018, SpO2 100 %. General appearance: alert  Disposition: Discharge disposition: 01-Home or Self Care       Discharge Instructions    Discharge activity:  No Restrictions   Complete by: As directed    No sexual activity restrictions   Complete by: As directed    Notify physician for a general feeling that "something is not right"   Complete by: As directed    Notify physician for leaking of fluid   Complete by: As directed    Notify physician for uterine contractions.  These may be painless and feel like the uterus is tightening or the baby is  "balling up"   Complete by: As directed    Notify physician for vaginal bleeding   Complete by: As directed      Allergies as of 09/26/2019      Reactions   Ciprofloxacin Anaphylaxis, Shortness Of Breath, Swelling   Morphine Nausea  And Vomiting   Nausea and vomitting   Morphine And Related Nausea And Vomiting      Medication List    STOP taking these medications   famotidine 20 MG tablet Commonly known as: PEPCID     TAKE these medications   acetaminophen 500 MG tablet Commonly known as: TYLENOL Take 1,000 mg by mouth every 6 (six) hours as needed.   butalbital-acetaminophen-caffeine 50-325-40 MG tablet Commonly known as: FIORICET Take 1 tablet by mouth every 4 (four) hours as needed for headache.   calcium carbonate 500 MG chewable tablet Commonly known as: TUMS - dosed in mg elemental calcium Chew 2 tablets by mouth daily.   cetirizine 10 MG tablet Commonly known as: ZYRTEC Take 10 mg by mouth once.   ferrous sulfate 325 (65 FE) MG tablet Take 325 mg by mouth daily with breakfast.   NIFEdipine 10 MG capsule Commonly known as: PROCARDIA Take 1 capsule (10 mg total) by mouth every 6 (six) hours as needed (ctx).   prenatal multivitamin Tabs tablet Take 1 tablet by mouth daily at 12 noon.        Signed: Blane Ohara Dealie Koelzer 09/27/2019, 12:25 AM

## 2019-09-29 ENCOUNTER — Observation Stay (HOSPITAL_COMMUNITY)
Admission: AD | Admit: 2019-09-29 | Discharge: 2019-09-30 | Disposition: A | Discharge status: Home / Self Care | Payer: BC Managed Care – PPO | Attending: Obstetrics and Gynecology | Admitting: Obstetrics and Gynecology

## 2019-09-29 ENCOUNTER — Other Ambulatory Visit: Payer: Self-pay

## 2019-09-29 ENCOUNTER — Encounter (HOSPITAL_COMMUNITY): Payer: Self-pay | Admitting: *Deleted

## 2019-09-29 DIAGNOSIS — O4703 False labor before 37 completed weeks of gestation, third trimester: Secondary | ICD-10-CM | POA: Diagnosis not present

## 2019-09-29 DIAGNOSIS — Z3A33 33 weeks gestation of pregnancy: Secondary | ICD-10-CM | POA: Diagnosis not present

## 2019-09-29 LAB — URINALYSIS, ROUTINE W REFLEX MICROSCOPIC
Bilirubin Urine: NEGATIVE
Glucose, UA: 150 mg/dL — AB
Hgb urine dipstick: NEGATIVE
Ketones, ur: 20 mg/dL — AB
Nitrite: NEGATIVE
Protein, ur: NEGATIVE mg/dL
Specific Gravity, Urine: 1.013 (ref 1.005–1.030)
pH: 6 (ref 5.0–8.0)

## 2019-09-29 MED ORDER — NIFEDIPINE 10 MG PO CAPS
10.0000 mg | ORAL_CAPSULE | ORAL | Status: AC | PRN
  Administered 2019-09-29 (×3): 10 mg via ORAL
  Filled 2019-09-29 (×3): qty 1

## 2019-09-29 MED ORDER — TERBUTALINE SULFATE 1 MG/ML IJ SOLN: 0.2500 mg | Freq: Once | INTRAMUSCULAR | Status: DC

## 2019-09-29 NOTE — MAU Provider Note (Addendum)
Chief Complaint:  Contractions   None     HPI: Donna MusaBobbie Marquez is a 28 y.o. G1P0 at 5979w3d by LMP who presents to maternity admissions reporting contractions every 5 minutes that are uncomfortable. She was admitted on 09/23/19 for preterm labor with cervix 3 cm,  regular contractions, and positive FFN on 10/23. She received magnesium sulfate x 24 hours and betamethasone x 2 during that admission. Her cervix remained 3 cm, contractions decreased, and she was discharged on 09/26/19.  Today, contractions went from every 10 minutes to every 5. The pain is cramping low abdominal pain that is intermittent and radiates to her low back.      She reports good fetal movement, denies LOF, vaginal bleeding, vaginal itching/burning, urinary symptoms, h/a, dizziness, n/v, or fever/chills.    HPI  Past Medical History: Past Medical History:  Diagnosis Date  . Allergic rhinitis   . Endometriosis   . History of concussion 2017 approx.   per pt no residual  . History of mononucleosis   . Hypochromic microcytic anemia   . PCOS (polycystic ovarian syndrome)   . Pelvic pain   . Preterm labor in third trimester 09/23/2019  . S/P gastric bypass 10/27/2017   gastric sleeve  . Wears glasses     Past obstetric history: OB History  Gravida Para Term Preterm AB Living  1            SAB TAB Ectopic Multiple Live Births               # Outcome Date GA Lbr Len/2nd Weight Sex Delivery Anes PTL Lv  1 Current             Past Surgical History: Past Surgical History:  Procedure Laterality Date  . ESOPHAGOGASTRODUODENOSCOPY (EGD) WITH PROPOFOL  09-16-2017   @[facility]   . FULGURATION OF LESION N/A 01/07/2019   Procedure: FULGURATION OF ENDOMETRIOSIS;  Surgeon: Sherian ReinBovard-Stuckert, Jody, MD;  Location: [facility];  Service: Gynecology;  Laterality: N/A;  . LAPAROSCOPIC GASTRIC SLEEVE RESECTION  10-27-2017    @[facility]   . LAPAROSCOPIC LYSIS OF ADHESIONS  01/07/2019   Procedure: LAPAROSCOPIC LYSIS OF  ADHESIONS;  Surgeon: Sherian ReinBovard-Stuckert, Jody, MD;  Location: [facility];  Service: Gynecology;;  . LAPAROSCOPY N/A 01/07/2019   Procedure: LAPAROSCOPY DIAGNOSTIC;  Surgeon: Sherian ReinBovard-Stuckert, Jody, MD;  [facility] ;  Service: Gynecology;  Laterality: N/A;  . WISDOM TOOTH EXTRACTION      Family History: History reviewed. No pertinent family history.  Social History: Social History   Tobacco Use  . Smoking status: Never Smoker  . Smokeless tobacco: Never Used  Substance Use Topics  . Alcohol use: Not Currently  . Drug use: Never    Allergies:  Allergies  Allergen Reactions  . Ciprofloxacin Anaphylaxis, Shortness Of Breath and Swelling  . Morphine Nausea And Vomiting    Nausea and vomitting  . Morphine And Related Nausea And Vomiting    Meds:  Medications Prior to Admission  Medication Sig Dispense Refill Last Dose  . acetaminophen (TYLENOL) 500 MG tablet Take 1,000 mg by mouth every 6 (six) hours as needed.   Past Month at Unknown time  . calcium carbonate (TUMS - DOSED IN MG ELEMENTAL CALCIUM) 500 MG chewable tablet Chew 2 tablets by mouth daily.   09/28/2019 at Unknown time  . ferrous sulfate 325 (65 FE) MG tablet Take 325 mg by mouth daily with breakfast.   09/29/2019 at Unknown time  . NIFEdipine (PROCARDIA) 10 MG  capsule Take 1 capsule (10 mg total) by mouth every 6 (six) hours as needed (ctx). 20 capsule 1 09/29/2019 at Unknown time  . Prenatal Vit-Fe Fumarate-FA (PRENATAL MULTIVITAMIN) TABS tablet Take 1 tablet by mouth daily at 12 noon.   09/29/2019 at Unknown time  . butalbital-acetaminophen-caffeine (FIORICET) 50-325-40 MG tablet Take 1 tablet by mouth every 4 (four) hours as needed for headache.     . cetirizine (ZYRTEC) 10 MG tablet Take 10 mg by mouth once.       ROS:  Review of Systems  Constitutional: Negative for chills, fatigue and fever.  Respiratory: Negative for shortness of breath.   Cardiovascular: Negative for chest  pain.  Gastrointestinal: Positive for abdominal pain. Negative for nausea and vomiting.  Genitourinary: Negative for difficulty urinating, dysuria, flank pain, pelvic pain, vaginal bleeding, vaginal discharge and vaginal pain.  Musculoskeletal: Positive for back pain.  Neurological: Negative for dizziness and headaches.  Psychiatric/Behavioral: Negative.      I have reviewed patient's Past Medical Hx, Surgical Hx, Family Hx, Social Hx, medications and allergies.   Physical Exam   Patient Vitals for the past 24 hrs:  BP Temp Pulse Resp Height Weight  09/29/19 2101 119/71 - 92 - - -  09/29/19 2041 110/65 - 87 - - -  09/29/19 2021 111/73 - 87 - - -  09/29/19 2001 114/71 - 88 - - -  09/29/19 1942 126/77 - 92 - - -  09/29/19 1941 126/77 - - - - -  09/29/19 1916 126/72 - 93 - - -  09/29/19 1901 127/73 - 96 - - -  09/29/19 1846 128/74 - 99 - - -  09/29/19 1831 126/71 - (!) 103 - - -  09/29/19 1821 134/73 - (!) 109 - - -  09/29/19 1805 (!) 142/73 98.3 F (36.8 C) (!) 118 18 5\' 6"  (1.676 m) 108 kg   Constitutional: Well-developed, well-nourished female in no acute distress.  Cardiovascular: normal rate Respiratory: normal effort GI: Abd soft, non-tender, gravid appropriate for gestational age.  MS: Extremities nontender, no edema, normal ROM Neurologic: Alert and oriented x 4.  GU: Neg CVAT.  PELVIC EXAM: Cervix pink, visually closed, without lesion, scant white creamy discharge, vaginal walls and external genitalia normal Bimanual exam: Cervix 0/long/high, firm, anterior, neg CMT, uterus nontender, nonenlarged, adnexa without tenderness, enlargement, or mass  Dilation: 3 Effacement (%): 70 Station: Ballotable Presentation: Vertex Exam by:: Blanch Media CNM  FHT:  Baseline 145 , moderate variability, accelerations present, no decelerations Contractions: q 3-4 mins   Labs: Results for orders placed or performed during the hospital encounter of 09/29/19 (from the past  24 hour(s))  Urinalysis, Routine w reflex microscopic     Status: Abnormal   Collection Time: 09/29/19  6:16 PM  Result Value Ref Range   Color, Urine YELLOW YELLOW   APPearance HAZY (A) CLEAR   Specific Gravity, Urine 1.013 1.005 - 1.030   pH 6.0 5.0 - 8.0   Glucose, UA 150 (A) NEGATIVE mg/dL   Hgb urine dipstick NEGATIVE NEGATIVE   Bilirubin Urine NEGATIVE NEGATIVE   Ketones, ur 20 (A) NEGATIVE mg/dL   Protein, ur NEGATIVE NEGATIVE mg/dL   Nitrite NEGATIVE NEGATIVE   Leukocytes,Ua TRACE (A) NEGATIVE   RBC / HPF 0-5 0 - 5 RBC/hpf   WBC, UA 0-5 0 - 5 WBC/hpf   Bacteria, UA RARE (A) NONE SEEN   Squamous Epithelial / LPF 0-5 0 - 5   Mucus PRESENT    --/--/A  POS, A POS Performed at [facility], [address], [place], [place] [address]  [phone])  Imaging:  No results found.  MAU Course/MDM: Orders Placed This Encounter  Procedures  . Urinalysis, Routine w reflex microscopic    Meds ordered this encounter  Medications  . NIFEdipine (PROCARDIA) capsule 10 mg  . DISCONTD: terbutaline (BRETHINE) injection 0.25 mg     NST reviewed and reactive Pt cervix unchanged on initial exam from previous exam at hospital discharge but with frequent painful contractions was treated with Procardia 10 mg x 3 doses Q 20 minutes. Contractions improved initially, then returned.  Cervix remains 3 cm but is softer, more effaced and with fetal head more applied and BBOW on repeat exam. Offered terbutaline and overnight observation but pt desires to go home if cervix not changing. She is s/p BMZ and mag sulfate on previous admission.  Will recheck in 1 hour.  Report to Wynelle Bourgeois, CNM.    Sharen Counter Certified Nurse-Midwife 09/29/2019 10:32 PM   Cervix has effaced more and membranes are bulging now Cervix 3cm/ 80%/ -1/vtx/BBOW UCs persist every 3-4 min  Discussed with Dr Mindi Slicker Will admit for observation Procardia 10mg  q6h MD to follow

## 2019-09-29 NOTE — MAU Note (Signed)
Pt called out stating she had blood when she went to the bathroom. Provider notified.

## 2019-09-29 NOTE — MAU Note (Addendum)
Pt reports sje was here last week for preterm labor.3cm when last checked. Stated ctx today went from 10 min apart to 5 min aprt. Took procardia and they have not slowed down. Denies any vag bleeding or leaking of fluid. Good fetal movement reported.

## 2019-09-30 DIAGNOSIS — Z3A33 33 weeks gestation of pregnancy: Secondary | ICD-10-CM | POA: Diagnosis not present

## 2019-09-30 DIAGNOSIS — O4703 False labor before 37 completed weeks of gestation, third trimester: Secondary | ICD-10-CM | POA: Diagnosis not present

## 2019-09-30 LAB — CBC
HCT: 32.3 % — ABNORMAL LOW (ref 36.0–46.0)
Hemoglobin: 10.8 g/dL — ABNORMAL LOW (ref 12.0–15.0)
MCH: 28.8 pg (ref 26.0–34.0)
MCHC: 33.4 g/dL (ref 30.0–36.0)
MCV: 86.1 fL (ref 80.0–100.0)
Platelets: 295 10*3/uL (ref 150–400)
RBC: 3.75 MIL/uL — ABNORMAL LOW (ref 3.87–5.11)
RDW: 13.1 % (ref 11.5–15.5)
WBC: 12.4 10*3/uL — ABNORMAL HIGH (ref 4.0–10.5)
nRBC: 0 % (ref 0.0–0.2)

## 2019-09-30 LAB — TYPE AND SCREEN
ABO/RH(D): A POS
Antibody Screen: NEGATIVE

## 2019-09-30 MED ORDER — PRENATAL MULTIVITAMIN CH
1.0000 | ORAL_TABLET | Freq: Every day | ORAL | Status: DC
  Filled 2019-09-30: qty 1

## 2019-09-30 MED ORDER — NIFEDIPINE 10 MG PO CAPS
10.0000 mg | ORAL_CAPSULE | Freq: Four times a day (QID) | ORAL | Status: DC
  Administered 2019-09-30 (×2): 10 mg via ORAL
  Filled 2019-09-30 (×2): qty 1

## 2019-09-30 MED ORDER — DOCUSATE SODIUM 100 MG PO CAPS
100.0000 mg | ORAL_CAPSULE | Freq: Every day | ORAL | Status: DC
  Filled 2019-09-30: qty 1

## 2019-09-30 MED ORDER — CALCIUM CARBONATE ANTACID 500 MG PO CHEW: 2.0000 | CHEWABLE_TABLET | ORAL | Status: DC | PRN

## 2019-09-30 MED ORDER — ACETAMINOPHEN 325 MG PO TABS: 650.0000 mg | ORAL_TABLET | ORAL | Status: DC | PRN

## 2019-09-30 MED ORDER — ZOLPIDEM TARTRATE 5 MG PO TABS: 5.0000 mg | ORAL_TABLET | Freq: Every evening | ORAL | Status: DC | PRN

## 2019-09-30 NOTE — Discharge Summary (Signed)
Physician Discharge Summary  Patient ID: Donna Marquez MRN: [id number] DOB/AGE: 08-24-1991 27 y.o.  Admit date: 09/29/2019 Discharge date: 09/30/2019  Admission Diagnoses: Preterm labor at 33 4/7 weeks  Discharge Diagnoses:  Active Problems:   Preterm labor in third trimester   Preterm labor   Discharged Condition: stable  Hospital Course: Pt admitted to observation for contractions and possible cervical change from prior admission.  Cervix on d/c last admission 3/80/-2 and felt to be slightly more effaced last PM.  Pt received betamethasone last admission 09/25/19 and 09/26/19.  She was monitored for more than 12 hours and cervix on discharge was not significantly changed 80/3+/-3 with prominent bag but vertex ballotable.  Pt was given option of longer observation given she lives 40 minutes from hospital but preferred d/c home.  She has procardia to take prn.  Precautions reviewed.    Discharge Exam: Blood pressure 125/68, pulse 76, temperature 98 F (36.7 C), temperature source Oral, resp. rate 18, height 5\' 6"  (1.676 m), weight 108 kg, last menstrual period 12/29/2018, SpO2 97 %. Pt alert and comfortable with no acute distress noted Cervix 80/3+/-3 vertex with bag od water prominent   Disposition:  Discharge disposition: 01-Home or Self Care       Discharge Instructions    Diet - low sodium heart healthy   Complete by: As directed    Discharge instructions   Complete by: As directed    Call or return to hospital with increasing frequency or intensity of contractions, or with leakage of fluid.     Allergies as of 09/30/2019      Reactions   Ciprofloxacin Anaphylaxis, Shortness Of Breath, Swelling   Morphine Nausea And Vomiting   Nausea and vomitting   Morphine And Related Nausea And Vomiting      Medication List    TAKE these medications   acetaminophen 500 MG tablet Commonly known as: TYLENOL Take 1,000 mg by mouth every 6 (six) hours as needed.    butalbital-acetaminophen-caffeine 50-325-40 MG tablet Commonly known as: FIORICET Take 1 tablet by mouth every 4 (four) hours as needed for headache.   calcium carbonate 500 MG chewable tablet Commonly known as: TUMS - dosed in mg elemental calcium Chew 2 tablets by mouth daily.   cetirizine 10 MG tablet Commonly known as: ZYRTEC Take 10 mg by mouth once.   ferrous sulfate 325 (65 FE) MG tablet Take 325 mg by mouth daily with breakfast.   NIFEdipine 10 MG capsule Commonly known as: PROCARDIA Take 1 capsule (10 mg total) by mouth every 6 (six) hours as needed (ctx).   prenatal multivitamin Tabs tablet Take 1 tablet by mouth daily at 12 noon.      Follow-up Information    Bovard-Stuckert, Jody, MD Follow up in 3 day(s).   Specialty: Obstetrics and Gynecology Why: Keep appointment as scheduled 10/03/19 Contact information: [address]  [place] [address] [phone]           Signed: Logan Bores 09/30/2019, 9:28 AM

## 2019-09-30 NOTE — Progress Notes (Signed)
Patient ID: Donna Marquez, female   DOB: 15-Apr-1991, 28 y.o.   MRN: [id number] Pt felt some increased contractions after exam this AM so was kept another 4 hours and rechecked.  Ctx mild and q 6-10 min  Cervix unchanged 80/3-4/-2 to -3  Pt wants to try to go home. D/c with precautions given

## 2019-09-30 NOTE — Progress Notes (Signed)
Discharge instructions given to pt. Discussed pre-term labor, signs and symptoms to report to the MD, upcoming appointments, and meds. Pt verbalizes understanding and has no questions or concerns at this time. Pt discharged in stable condition. 

## 2019-09-30 NOTE — H&P (Addendum)
Donna Marquez is a 28 y.o.prime female presenting for painful contractions. Pt had similar contractions on 09/23/19. Was noted to be 3cm dilated with positive FFN. Pt was hospitalized, tocolyzed with procardia and MgSO4. Pt made no further cervical change after 46hrs so was discharged home. Today, cervix still 3cm dilated but more effaced ( 90%). Pt continues to have contractions despite procardia given in MAU x 3.  Pt is dated per a 7 week [place]. She has had GERD, anemia, insulin resistance ( abnormal 1hr ; opted to check FSBS rather than 3hr - good control on diet) along with migraines. Pt is obese; s/p gastric sleeve. GBS is negative OB History    Gravida  1   Para      Term      Preterm      AB      Living        SAB      TAB      Ectopic      Multiple      Live Births             Past Medical History:  Diagnosis Date  . Allergic rhinitis   . Endometriosis   . History of concussion 2017 approx.   per pt no residual  . History of mononucleosis   . Hypochromic microcytic anemia   . PCOS (polycystic ovarian syndrome)   . Pelvic pain   . Preterm labor in third trimester 09/23/2019  . S/P gastric bypass 10/27/2017   gastric sleeve  . Wears glasses    Past Surgical History:  Procedure Laterality Date  . ESOPHAGOGASTRODUODENOSCOPY (EGD) WITH PROPOFOL  09-16-2017   @[facility]   . FULGURATION OF LESION N/A 01/07/2019   Procedure: FULGURATION OF ENDOMETRIOSIS;  Surgeon: 03/08/2019, MD;  Location: [facility];  Service: Gynecology;  Laterality: N/A;  . LAPAROSCOPIC GASTRIC SLEEVE RESECTION  10-27-2017    @[facility]   . LAPAROSCOPIC LYSIS OF ADHESIONS  01/07/2019   Procedure: LAPAROSCOPIC LYSIS OF ADHESIONS;  Surgeon: , MD;  Location: [facility];  Service: Gynecology;;  . LAPAROSCOPY N/A 01/07/2019   Procedure: LAPAROSCOPY DIAGNOSTIC;  Surgeon: Sherian Rein, MD;  Location: [facility];  Service:  Gynecology;  Laterality: N/A;  . WISDOM TOOTH EXTRACTION     Family History: family history is not on file. Social History:  reports that she has never smoked. She has never used smokeless tobacco. She reports previous alcohol use. She reports that she does not use drugs.     Maternal Diabetes: No Genetic Screening: Normal Maternal Ultrasounds/Referrals: Normal Fetal Ultrasounds or other Referrals:  None Maternal Substance Abuse:  No Significant Maternal Medications:  None Significant Maternal Lab Results:  Group B Strep negative Other Comments:  None  ROS History Dilation: 3 Effacement (%): 80 Station: [place], -1 Exam by:: Frisco Leftwhich Kirby CNM Blood pressure 119/71, pulse 92, temperature 98.3 F (36.8 C), resp. rate 18, height 5\' 6"  (1.676 m), weight 108 kg, last menstrual period 12/29/2018. Exam Physical Exam  Prenatal labs: ABO, Rh: --/--/A POS, A POS Performed at [facility], [address] [place], [facility] [address]  [phone]) Antibody: NEG (10/24 0145) Rubella:   RPR:    HBsAg:    HIV: Non Reactive (05/22 0037)  GBS:     Assessment/Plan: Prime at 33 4/7wks with preterm contractions Admit for observation Recheck cervix prn If no further cervical change overnight consider discharge home Cat 1 screen  Procardia 10mg  q  [place] 09/30/2019, 12:17 AM

## 2019-09-30 NOTE — Discharge Instructions (Signed)

## 2019-09-30 NOTE — Progress Notes (Signed)
Dr. Marvel Plan notified of increased ctx frequency. Pt states the ctx are not any more painful than earlier, but she does feel some pressure with each ctx. Told to continue to monitor pt. Will monitor and update MD on pt status this afternoon.

## 2019-09-30 NOTE — Progress Notes (Signed)
Patient ID: Donna Marquez, female   DOB: 08/24/1991, 28 y.o.   MRN: [id number] HD #2 [id number] 4/7 weeks PTL Pt reports still feeling contractions overnight but more spaced out and milder in intensity.  Received procardia this AM.  afeb VSS FHR category 1  Cervix 80/3+/-3 with bag slightly bulging, head ballotable  Pt with no significant cervical change overnight S/p betamethasone x 2 (10/24 and 10/25) Lives in [place] about 40 min from hospital.  Reviewed with patient options of continued observation this AM vs d/c home with precautions and prefers d/c home.  Advised to return to hospital with increased frequency or intensity of contractions

## 2019-10-17 DIAGNOSIS — Z3685 Encounter for antenatal screening for Streptococcus B: Secondary | ICD-10-CM | POA: Diagnosis not present

## 2019-10-17 LAB — OB RESULTS CONSOLE GBS: GBS: NEGATIVE

## 2019-11-01 ENCOUNTER — Other Ambulatory Visit: Payer: Self-pay

## 2019-11-01 ENCOUNTER — Inpatient Hospital Stay (HOSPITAL_COMMUNITY): Payer: BC Managed Care – PPO | Admitting: Anesthesiology

## 2019-11-01 ENCOUNTER — Encounter (HOSPITAL_COMMUNITY): Payer: Self-pay | Admitting: *Deleted

## 2019-11-01 ENCOUNTER — Inpatient Hospital Stay (HOSPITAL_COMMUNITY)
Admission: AD | Admit: 2019-11-01 | Discharge: 2019-11-04 | DRG: 807 | Disposition: A | Discharge status: Home / Self Care | Payer: BC Managed Care – PPO | Attending: Obstetrics and Gynecology | Admitting: Obstetrics and Gynecology

## 2019-11-01 DIAGNOSIS — Z0542 Observation and evaluation of newborn for suspected metabolic condition ruled out: Secondary | ICD-10-CM | POA: Diagnosis not present

## 2019-11-01 DIAGNOSIS — Z23 Encounter for immunization: Secondary | ICD-10-CM | POA: Diagnosis not present

## 2019-11-01 DIAGNOSIS — O99214 Obesity complicating childbirth: Secondary | ICD-10-CM | POA: Diagnosis not present

## 2019-11-01 DIAGNOSIS — O9962 Diseases of the digestive system complicating childbirth: Secondary | ICD-10-CM | POA: Diagnosis not present

## 2019-11-01 DIAGNOSIS — O99844 Bariatric surgery status complicating childbirth: Secondary | ICD-10-CM | POA: Diagnosis present

## 2019-11-01 DIAGNOSIS — D649 Anemia, unspecified: Secondary | ICD-10-CM | POA: Diagnosis present

## 2019-11-01 DIAGNOSIS — O24420 Gestational diabetes mellitus in childbirth, diet controlled: Principal | ICD-10-CM | POA: Diagnosis present

## 2019-11-01 DIAGNOSIS — Z20828 Contact with and (suspected) exposure to other viral communicable diseases: Secondary | ICD-10-CM | POA: Diagnosis present

## 2019-11-01 DIAGNOSIS — O26893 Other specified pregnancy related conditions, third trimester: Secondary | ICD-10-CM | POA: Diagnosis not present

## 2019-11-01 DIAGNOSIS — O9902 Anemia complicating childbirth: Secondary | ICD-10-CM | POA: Diagnosis not present

## 2019-11-01 DIAGNOSIS — K219 Gastro-esophageal reflux disease without esophagitis: Secondary | ICD-10-CM | POA: Diagnosis present

## 2019-11-01 DIAGNOSIS — R634 Abnormal weight loss: Secondary | ICD-10-CM | POA: Diagnosis not present

## 2019-11-01 DIAGNOSIS — Z3A38 38 weeks gestation of pregnancy: Secondary | ICD-10-CM

## 2019-11-01 DIAGNOSIS — O24429 Gestational diabetes mellitus in childbirth, unspecified control: Secondary | ICD-10-CM | POA: Diagnosis not present

## 2019-11-01 LAB — CBC
HCT: 34.2 % — ABNORMAL LOW (ref 36.0–46.0)
Hemoglobin: 11.1 g/dL — ABNORMAL LOW (ref 12.0–15.0)
MCH: 28 pg (ref 26.0–34.0)
MCHC: 32.5 g/dL (ref 30.0–36.0)
MCV: 86.1 fL (ref 80.0–100.0)
Platelets: 312 10*3/uL (ref 150–400)
RBC: 3.97 MIL/uL (ref 3.87–5.11)
RDW: 13.3 % (ref 11.5–15.5)
WBC: 12.7 10*3/uL — ABNORMAL HIGH (ref 4.0–10.5)
nRBC: 0 % (ref 0.0–0.2)

## 2019-11-01 LAB — TYPE AND SCREEN
ABO/RH(D): A POS
Antibody Screen: NEGATIVE

## 2019-11-01 LAB — GLUCOSE, CAPILLARY: Glucose-Capillary: 78 mg/dL (ref 70–99)

## 2019-11-01 MED ORDER — LACTATED RINGERS IV SOLN
500.0000 mL | Freq: Once | INTRAVENOUS | Status: AC
  Administered 2019-11-01: 500 mL via INTRAVENOUS

## 2019-11-01 MED ORDER — ACETAMINOPHEN 325 MG PO TABS: 650.0000 mg | ORAL_TABLET | ORAL | Status: DC | PRN

## 2019-11-01 MED ORDER — LACTATED RINGERS IV SOLN
INTRAVENOUS | Status: DC
  Administered 2019-11-01: 23:00:00 via INTRAVENOUS

## 2019-11-01 MED ORDER — OXYTOCIN 40 UNITS IN NORMAL SALINE INFUSION - SIMPLE MED
2.5000 [IU]/h | INTRAVENOUS | Status: DC
  Filled 2019-11-01: qty 1000

## 2019-11-01 MED ORDER — SODIUM CHLORIDE (PF) 0.9 % IJ SOLN
INTRAMUSCULAR | Status: DC | PRN
  Administered 2019-11-01: 12 mL/h via EPIDURAL

## 2019-11-01 MED ORDER — PHENYLEPHRINE 40 MCG/ML (10ML) SYRINGE FOR IV PUSH (FOR BLOOD PRESSURE SUPPORT): 80.0000 ug | PREFILLED_SYRINGE | INTRAVENOUS | Status: DC | PRN

## 2019-11-01 MED ORDER — EPHEDRINE 5 MG/ML INJ: 10.0000 mg | INTRAVENOUS | Status: DC | PRN

## 2019-11-01 MED ORDER — LIDOCAINE HCL (PF) 1 % IJ SOLN
INTRAMUSCULAR | Status: DC | PRN
  Administered 2019-11-01: 10 mL via EPIDURAL

## 2019-11-01 MED ORDER — TERBUTALINE SULFATE 1 MG/ML IJ SOLN: 0.2500 mg | Freq: Once | INTRAMUSCULAR | Status: DC | PRN

## 2019-11-01 MED ORDER — ONDANSETRON HCL 4 MG/2ML IJ SOLN: 4.0000 mg | Freq: Four times a day (QID) | INTRAMUSCULAR | Status: DC | PRN

## 2019-11-01 MED ORDER — SOD CITRATE-CITRIC ACID 500-334 MG/5ML PO SOLN: 30.0000 mL | ORAL | Status: DC | PRN

## 2019-11-01 MED ORDER — OXYTOCIN 40 UNITS IN NORMAL SALINE INFUSION - SIMPLE MED
1.0000 m[IU]/min | INTRAVENOUS | Status: DC
  Administered 2019-11-01: 2 m[IU]/min via INTRAVENOUS

## 2019-11-01 MED ORDER — FLEET ENEMA 7-19 GM/118ML RE ENEM: 1.0000 | ENEMA | RECTAL | Status: DC | PRN

## 2019-11-01 MED ORDER — DIPHENHYDRAMINE HCL 50 MG/ML IJ SOLN: 12.5000 mg | INTRAMUSCULAR | Status: DC | PRN

## 2019-11-01 MED ORDER — LACTATED RINGERS IV SOLN: 500.0000 mL | INTRAVENOUS | Status: DC | PRN

## 2019-11-01 MED ORDER — OXYTOCIN BOLUS FROM INFUSION
500.0000 mL | Freq: Once | INTRAVENOUS | Status: AC
  Administered 2019-11-02: 500 mL via INTRAVENOUS

## 2019-11-01 MED ORDER — LIDOCAINE HCL (PF) 1 % IJ SOLN: 30.0000 mL | INTRAMUSCULAR | Status: DC | PRN

## 2019-11-01 MED ORDER — FENTANYL-BUPIVACAINE-NACL 0.5-0.125-0.9 MG/250ML-% EP SOLN
12.0000 mL/h | EPIDURAL | Status: DC | PRN
  Filled 2019-11-01: qty 250

## 2019-11-01 NOTE — Anesthesia Procedure Notes (Signed)
Epidural Patient location during procedure: OB Start time: 11/01/2019 10:30 PM End time: 11/01/2019 10:47 PM  Staffing Anesthesiologist: Lidia Collum, MD Performed: anesthesiologist   Preanesthetic Checklist Completed: patient identified, pre-op evaluation, timeout performed, IV checked, risks and benefits discussed and monitors and equipment checked  Epidural Patient position: sitting Prep: DuraPrep Patient monitoring: heart rate, continuous pulse ox and blood pressure Approach: midline Location: L3-L4 Injection technique: LOR air  Needle:  Needle type: Tuohy  Needle gauge: 17 G Needle length: 9 cm Needle insertion depth: 7 cm Catheter type: closed end flexible Catheter size: 19 Gauge Catheter at skin depth: 12 cm Test dose: negative  Assessment Events: blood not aspirated, injection not painful, no injection resistance, negative IV test and no paresthesia  Additional Notes Reason for block:procedure for pain

## 2019-11-01 NOTE — H&P (Signed)
Donna Marquez is a 28 y.o. female presenting for ctxs and bloody show. Seen in office today, was 5cm and membranes stripped. +FM, denies LOF. Ctx q7-36m.   Pt is dated per a 7 week [place]. She has had GERD, anemia, insulin resistance (abnormal 1hr ; opted to check FSBS rather than 3hr - good control on diet) along with migraines (no meds). Pt is obese; s/p gastric sleeve.GBS is negative. Admitted for PTL in October and therefore received BMTYZ course 10/24-25  OB History    Gravida  1   Para      Term      Preterm      AB      Living        SAB      TAB      Ectopic      Multiple      Live Births             Past Medical History:  Diagnosis Date  . Allergic rhinitis   . Endometriosis   . History of concussion 2017 approx.   per pt no residual  . History of mononucleosis   . Hypochromic microcytic anemia   . PCOS (polycystic ovarian syndrome)   . Pelvic pain   . Preterm labor in third trimester 09/23/2019  . S/P gastric bypass 10/27/2017   gastric sleeve  . Wears glasses    Past Surgical History:  Procedure Laterality Date  . ESOPHAGOGASTRODUODENOSCOPY (EGD) WITH PROPOFOL  09-16-2017   @[facility]   . FULGURATION OF LESION N/A 01/07/2019   Procedure: FULGURATION OF ENDOMETRIOSIS;  Surgeon: 03/08/2019, MD;  Location: [facility];  Service: Gynecology;  Laterality: N/A;  . LAPAROSCOPIC GASTRIC SLEEVE RESECTION  10-27-2017    @[facility]   . LAPAROSCOPIC LYSIS OF ADHESIONS  01/07/2019   Procedure: LAPAROSCOPIC LYSIS OF ADHESIONS;  Surgeon: , MD;  Location: [facility];  Service: Gynecology;;  . LAPAROSCOPY N/A 01/07/2019   Procedure: LAPAROSCOPY DIAGNOSTIC;  Surgeon: Sherian Rein, MD;  Location: [facility] ;  Service: Gynecology;  Laterality: N/A;  . WISDOM TOOTH EXTRACTION     Family History: family history is not on file. Social History:  reports that she has never smoked. She has never used  smokeless tobacco. She reports previous alcohol use. She reports that she does not use drugs.     Maternal Diabetes: Yes:  Diabetes Type:  Diet controlled Genetic Screening: Normal Maternal Ultrasounds/Referrals: Other: Low lying placenta Fetal Ultrasounds or other Referrals:  None Maternal Substance Abuse:  No Significant Maternal Medications:  Meds include: Protonix Other:  Significant Maternal Lab Results:  Group B Strep negative Other Comments:  None  Review of Systems  Constitutional: Negative for chills and fever.  Eyes: Negative for blurred vision.  Respiratory: Negative for shortness of breath.   Cardiovascular: Negative for chest pain, palpitations and leg swelling.  Gastrointestinal: Negative for abdominal pain, nausea and vomiting.  Neurological: Negative for dizziness, weakness and headaches.  Psychiatric/Behavioral: Negative for suicidal ideas.   Maternal Medical History:  Reason for admission: Contractions.  Nausea.  Contractions: Onset was 1-2 hours ago.   Frequency: regular.   Perceived severity is moderate.    Fetal activity: Perceived fetal activity is normal.   Last perceived fetal movement was within the past hour.    Prenatal complications: Placental abnormality (LLP resolved) and preterm labor.   Prenatal Complications - Diabetes: gestational. Diabetes is managed by diet.      Dilation: 6 Effacement (%):  20 Station: -2 Exam by:: Dr. Wilhelmenia Blase Blood pressure 127/80, pulse 97, temperature 98 F (36.7 C), temperature source Oral, resp. rate 18, height 5\' 6"  (1.676 m), weight 112.1 kg, last menstrual period 12/29/2018. Exam Physical Exam  Constitutional: She is oriented to person, place, and time. She appears well-developed and well-nourished. No distress.  HENT:  Head: Normocephalic and atraumatic.  Eyes: Pupils are equal, round, and reactive to light.  Neck: Normal range of motion. Neck supple.  Cardiovascular: Normal rate and regular rhythm. Exam  reveals no gallop.  No murmur heard. GI: There is no abdominal tenderness. There is no rebound and no guarding.  Genitourinary:    Vagina and uterus normal.   Musculoskeletal: Normal range of motion.  Neurological: She is alert and oriented to person, place, and time.  Skin: Skin is warm and dry.    Prenatal labs: ABO, Rh: --/--/A POS (12/01 2023) Antibody: NEG (12/01 2023) Rubella:  Imm RPR:   nr HBsAg:   neg HIV: Non Reactive (05/22 0037)  GBS:   neg  Category 1 tracing, baseline 130bpm TOCO ctx q2-62m  Assessment/Plan: This is a 28yo G1P0 @ 68 1/7 admitted in labor. PNC s/f LLP (resolved), GDMA1, anemia, h/o migraines, GERD, prior admission for PTL s/p BMTZ course. EFW 8lb, GBS neg  Admit to L&D. Desires epidural. CEFM, CLD. Anticipate SVD. Plan for AROM and pitocin as needed   Deliah Boston 11/01/2019, 10:29 PM

## 2019-11-01 NOTE — Progress Notes (Signed)
Labor Note  S: More comfortable s/p epidural, no other complaints  O: BP 128/79   Pulse 75   Temp 98 F (36.7 C) (Oral)   Resp 16   Ht 5\' 6"  (1.676 m)   Wt 112.1 kg   LMP 12/29/2018 (Exact Date)   SpO2 98%   BMI 39.88 kg/m  CE: Clear AROM 2330, 6/90/-1 FHR: Baseline 125, +accels, -decels, modvariability TOCO irregular q2-7  A/P: This is a 28 y.o. G1P0 at [redacted]w[redacted]d  admitted in labor. GBS neg. PNC s/f LLP (resolved), GDMA1, anemia, h/o migraines, GERD, prior admission for PTL s/p BMTZ course.  FWB: vatagory 1 MWB: s/p epidural, comfortable Labor course: Irr contractions pattern, minimal change since admission, now s/p AROM, will initiate pitocin to augment.   Anticipate SVD

## 2019-11-01 NOTE — MAU Note (Signed)
PT SAYS NAUSEA STARTED AT 9AM-NO MEDS.  HAD AN APPOINTMENT  TODAY WITH DR BOVARD-  VE 5 CM . FEELS UC.  HAD MEMBRANES STRIPPED .  DENIES HSV AND MRSA. GBS-  NEG

## 2019-11-01 NOTE — Anesthesia Preprocedure Evaluation (Signed)
Anesthesia Evaluation  Patient identified by MRN, date of birth, ID band Patient awake    Reviewed: Allergy & Precautions, H&P , NPO status , Patient's Chart, lab work & pertinent test results  History of Anesthesia Complications Negative for: history of anesthetic complications  Airway Mallampati: II  TM Distance: >3 FB Neck ROM: full    Dental no notable dental hx.    Pulmonary neg pulmonary ROS,    Pulmonary exam normal        Cardiovascular negative cardio ROS Normal cardiovascular exam Rhythm:regular Rate:Normal     Neuro/Psych negative neurological ROS  negative psych ROS   GI/Hepatic negative GI ROS, Neg liver ROS,   Endo/Other  diabetes, GestationalMorbid obesity  Renal/GU negative Renal ROS  negative genitourinary   Musculoskeletal   Abdominal   Peds  Hematology negative hematology ROS (+)   Anesthesia Other Findings   Reproductive/Obstetrics (+) Pregnancy                             Anesthesia Physical Anesthesia Plan  ASA: III  Anesthesia Plan: Epidural   Post-op Pain Management:    Induction:   PONV Risk Score and Plan:   Airway Management Planned:   Additional Equipment:   Intra-op Plan:   Post-operative Plan:   Informed Consent: I have reviewed the patients History and Physical, chart, labs and discussed the procedure including the risks, benefits and alternatives for the proposed anesthesia with the patient or authorized representative who has indicated his/her understanding and acceptance.       Plan Discussed with:   Anesthesia Plan Comments:         Anesthesia Quick Evaluation

## 2019-11-02 ENCOUNTER — Encounter (HOSPITAL_COMMUNITY): Payer: Self-pay

## 2019-11-02 LAB — CBC
HCT: 29.3 % — ABNORMAL LOW (ref 36.0–46.0)
Hemoglobin: 9.5 g/dL — ABNORMAL LOW (ref 12.0–15.0)
MCH: 28 pg (ref 26.0–34.0)
MCHC: 32.4 g/dL (ref 30.0–36.0)
MCV: 86.4 fL (ref 80.0–100.0)
Platelets: 252 10*3/uL (ref 150–400)
RBC: 3.39 MIL/uL — ABNORMAL LOW (ref 3.87–5.11)
RDW: 13.4 % (ref 11.5–15.5)
WBC: 14.5 10*3/uL — ABNORMAL HIGH (ref 4.0–10.5)
nRBC: 0 % (ref 0.0–0.2)

## 2019-11-02 LAB — SARS CORONAVIRUS 2 (TAT 6-24 HRS): SARS Coronavirus 2: NEGATIVE

## 2019-11-02 LAB — RPR: RPR Ser Ql: NONREACTIVE

## 2019-11-02 LAB — GLUCOSE, CAPILLARY: Glucose-Capillary: 114 mg/dL — ABNORMAL HIGH (ref 70–99)

## 2019-11-02 MED ORDER — ZOLPIDEM TARTRATE 5 MG PO TABS: 5.0000 mg | ORAL_TABLET | Freq: Every evening | ORAL | Status: DC | PRN

## 2019-11-02 MED ORDER — WITCH HAZEL-GLYCERIN EX PADS: 1.0000 "application " | MEDICATED_PAD | CUTANEOUS | Status: DC | PRN

## 2019-11-02 MED ORDER — ONDANSETRON HCL 4 MG/2ML IJ SOLN: 4.0000 mg | INTRAMUSCULAR | Status: DC | PRN

## 2019-11-02 MED ORDER — ONDANSETRON HCL 4 MG PO TABS: 4.0000 mg | ORAL_TABLET | ORAL | Status: DC | PRN

## 2019-11-02 MED ORDER — SENNOSIDES-DOCUSATE SODIUM 8.6-50 MG PO TABS
2.0000 | ORAL_TABLET | ORAL | Status: DC
  Administered 2019-11-03 (×2): 2 via ORAL
  Filled 2019-11-02 (×2): qty 2

## 2019-11-02 MED ORDER — DIPHENHYDRAMINE HCL 25 MG PO CAPS: 25.0000 mg | ORAL_CAPSULE | Freq: Four times a day (QID) | ORAL | Status: DC | PRN

## 2019-11-02 MED ORDER — IBUPROFEN 600 MG PO TABS: 600.0000 mg | ORAL_TABLET | Freq: Four times a day (QID) | ORAL | Status: DC

## 2019-11-02 MED ORDER — OXYCODONE HCL 5 MG PO TABS
5.0000 mg | ORAL_TABLET | Freq: Four times a day (QID) | ORAL | Status: DC | PRN
  Administered 2019-11-02 – 2019-11-03 (×2): 5 mg via ORAL
  Filled 2019-11-02 (×2): qty 1

## 2019-11-02 MED ORDER — DIBUCAINE (PERIANAL) 1 % EX OINT: 1.0000 "application " | TOPICAL_OINTMENT | CUTANEOUS | Status: DC | PRN

## 2019-11-02 MED ORDER — PRENATAL MULTIVITAMIN CH
1.0000 | ORAL_TABLET | Freq: Every day | ORAL | Status: DC
  Administered 2019-11-02 – 2019-11-04 (×3): 1 via ORAL
  Filled 2019-11-02 (×3): qty 1

## 2019-11-02 MED ORDER — TETANUS-DIPHTH-ACELL PERTUSSIS 5-2.5-18.5 LF-MCG/0.5 IM SUSP: 0.5000 mL | Freq: Once | INTRAMUSCULAR | Status: DC

## 2019-11-02 MED ORDER — ACETAMINOPHEN 325 MG PO TABS
650.0000 mg | ORAL_TABLET | ORAL | Status: DC | PRN
  Administered 2019-11-02 – 2019-11-04 (×10): 650 mg via ORAL
  Filled 2019-11-02 (×10): qty 2

## 2019-11-02 MED ORDER — BENZOCAINE-MENTHOL 20-0.5 % EX AERO
1.0000 "application " | INHALATION_SPRAY | CUTANEOUS | Status: DC | PRN
  Filled 2019-11-02: qty 56

## 2019-11-02 MED ORDER — SIMETHICONE 80 MG PO CHEW: 80.0000 mg | CHEWABLE_TABLET | ORAL | Status: DC | PRN

## 2019-11-02 MED ORDER — COCONUT OIL OIL
1.0000 "application " | TOPICAL_OIL | Status: DC | PRN
  Administered 2019-11-03: 1 via TOPICAL

## 2019-11-02 NOTE — Anesthesia Postprocedure Evaluation (Signed)
Anesthesia Post Note  Patient: Donna Marquez  Procedure(s) Performed: AN AD HOC LABOR EPIDURAL     Patient location during evaluation: Mother Baby Anesthesia Type: Epidural Level of consciousness: awake, awake and alert and oriented Pain management: pain level controlled Vital Signs Assessment: post-procedure vital signs reviewed and stable Respiratory status: spontaneous breathing and respiratory function stable Cardiovascular status: blood pressure returned to baseline Postop Assessment: no headache, epidural receding, patient able to bend at knees, adequate PO intake, no backache, no apparent nausea or vomiting and able to ambulate Anesthetic complications: no    Last Vitals:  Vitals:   11/02/19 0316 11/02/19 0417  BP: 129/78 130/83  Pulse: 81 84  Resp: 16 18  Temp: 36.9 C 36.7 C  SpO2: 99% 99%    Last Pain:  Vitals:   11/02/19 0417  TempSrc:   PainSc: 5    Pain Goal:                   Bufford Spikes

## 2019-11-02 NOTE — Lactation Note (Signed)
This note was copied from a baby's chart. Lactation Consultation Note  Patient Name: Donna Marquez [id number] Date: 11/02/2019 Reason for consult: Follow-up assessment;Primapara;1st time breastfeeding;Early term 37-38.6wks;Other (Comment);Maternal endocrine disorder Type of Endocrine Disorder?: PCOS(gastric sleeve 10/27/2017 / hypochromic microcytic amemia)  Baby is 13 hours,  MBURN - [name] reported to this [place] earlier today mom had challenging  Tissue latching ( semi flat and areola edema )  LC recommended lots of STS, hand expressing , breast massage, hand expressing and pre - pumping with the hand pump to stretch the nipple - areola complex for a deeper latch and reverse pressure.  MBURN sized mom with #20 NS and baby did latch at 1210 for 10 mins .  Mom reported comfort and colostrum in the NS  after feeding .  MBURN reported after feeding she felt the #20 NS was to snug and needed to  Be resized.  As LC entered the room at this consult - mom holding baby asleep.  And was receptive allowing the LC to resize the NS and the [facility] felt the #20 NS was to snug and not accommodating the base of the areola well.  LC sized for #24 NS and it was a better fit and per mom comfortable.   Since the baby is sleeping / [place] recommended and ask mom to call with 'feeding cues for a feeding assessment with the #24 NS.   The MBU RN aware to set mom up with the DEBP for post pumping.    Maternal Data Has patient been taught Hand Expression?: Yes  Feeding Feeding Type: (baby ate around 12n for 15 - 20 mins , mom reports colostrum in the NS)  LATCH Score                   Interventions Interventions: Breast feeding basics reviewed;Hand pump  Lactation Tools Discussed/Used Tools: Shells;Pump Nipple shield size: 20;24(LC checked the NS sizing ( the #24 NS is the better fit )) Shell Type: Inverted Breast pump type: Manual   Consult Status Consult Status: Follow-up Date:  11/02/19 Follow-up type: In-patient    [place] 11/02/2019, 2:34 PM

## 2019-11-02 NOTE — Lactation Note (Signed)
This note was copied from a baby's chart. Lactation Consultation Note  Patient Name: Donna Marquez Date: 11/02/2019 Reason for consult: Follow-up assessment;Mother's request;Primapara;1st time breastfeeding;Early term 37-38.6wks Type of Endocrine Disorder?: PCOS  1536 - I followed up with Donna Marquez upon request. She was breast feeding baby in football hold on her left breast upon entry using a size 24 NS. The size 24 appeared to be the appropriate fit for her tissue.  Baby was holding the shield in his mouth upon entry and not eating. RN in room stated that he had been feeding prior to entry, and Donna Marquez states that he ate for about 5 minutes. I tried gentle waking measures with baby, but he did not respond. I swaddled him and assisted with setting up the DEBP.  I opted for size 27 flanges and helped Donna Marquez initiate her first pump using the initiation setting. I reviewed day 1 and night 2 feeding patterns, feeding frequency, signs of transfer, and when to call lactation for assistance. She verbalized understanding.  PLAN: Breast feed baby 8-12 times a day on demand. Wake to feed if needed. Pump following breast feeding for stimulation purposes. Finger or spoon feed any EBM back to baby. Use 24 nipple shield if helpful. Observe for transfer of colostrum in the shield post feeding.  Maternal Data Has patient been taught Hand Expression?: Yes  Feeding Feeding Type: Breast Fed  LATCH Score Latch: Repeated attempts needed to sustain latch, nipple held in mouth throughout feeding, stimulation needed to elicit sucking reflex.  Audible Swallowing: None  Type of Nipple: Flat  Comfort (Breast/Nipple): Soft / non-tender  Hold (Positioning): Assistance needed to correctly position infant at breast and maintain latch.  LATCH Score: 5  Interventions Interventions: Breast feeding basics reviewed;DEBP  Lactation Tools Discussed/Used Tools: Pump;Flanges Nipple  shield size: 24 Shell Type: Inverted Breast pump type: Double-Electric Breast Pump WIC Program: No Pump Review: Setup, frequency, and cleaning Initiated by:: Elayne Guerin Date initiated:: 11/02/19   Consult Status Consult Status: Follow-up Date: 11/02/19 Follow-up type: In-patient    Lenore Manner 11/02/2019, 3:52 PM

## 2019-11-02 NOTE — Progress Notes (Signed)
Called regarding FHJR strip. Cat 2 tracing with mixture of early and variable decels. CE now complete/+1. Initiate active mgmt of second stage of labor at this time.

## 2019-11-02 NOTE — Progress Notes (Signed)
PPD #1 No problems Afeb, VSS Fundus firm, NT at U-1 Continue routine postpartum care, ambulate.  Will d/c Ibuprofen since has h/o gastric sleeve, will Rx oxycodone if needs anything stronger than tylenol.  Baby for circumcision tomorrow

## 2019-11-02 NOTE — Lactation Note (Signed)
This note was copied from a baby's chart. Lactation Consultation Note  Patient Name: Donna Marquez Date: 11/02/2019 Reason for consult: Initial assessment;1st time breastfeeding;Maternal endocrine disorder;Early term 37-38.6wks Type of Endocrine Disorder?: PCOS P1, 5 hour ETI. Mom with hx: PCOS and GDM. Per mom, she feels breastfeeding is going well,  infant breastfeed twice 15 minutes in L&D and 15 minutes in her room. Mom was doing STS with infant when [facility] entered the room. LC did not observed latch infant last breastfeed at 4:15 am. Per mom, she took her nipple rings out one month ago, she was concern she doesn't have any breast milk. LC discussed hand expression and mom taught back, mom was happy to see colostrum present in both breast. Mom knows to breastfeed infant according hunger cues, 8 to 12 times within 24 hours and on demand. Parents will do as much STS as possible. Mom knows to call RN or [facility] services, if she has any further questions, concerns or need assistance with latching infant at breast. Reviewed Baby & Me book's Breastfeeding Basics.  Mom made aware of O/P services, breastfeeding support groups, community resources, and our phone # for post-discharge questions.   Maternal Data Formula Feeding for Exclusion: No Has patient been taught Hand Expression?: Yes(MOm has colostrum present both breast.) Does the patient have breastfeeding experience prior to this delivery?: No  Feeding Feeding Type: Breast Fed  LATCH Score                   Interventions Interventions: Skin to skin;Breast feeding basics reviewed;Hand express  Lactation Tools Discussed/Used WIC Program: No   Consult Status Consult Status: Follow-up Date: 11/02/19 Follow-up type: In-patient    Vicente Serene 11/02/2019, 6:21 AM

## 2019-11-03 NOTE — Lactation Note (Signed)
This note was copied from a baby's chart. Lactation Consultation Note  Patient Name: Donna Marquez BJSEG'[id number] Date: 11/03/2019 Reason for consult: Follow-up assessment;Primapara;Early term 37-38.6wks;1st time breastfeeding;Infant weight loss;Other (Comment)(8 % weight loss/ per mom last fed at 5:15 pm) Type of Endocrine Disorder?: PCOS.  Baby is 72 hours old, per mom the baby recently fed at 5:15 pm to 6 :10p  Per mom swallows and comfort.  When LC entered the room baby was sleeping and woke up for short interval and mom showed [place] how she was latching.  LC offered suggestions with pillow support for increased depth.  Baby latched without the NS.  LC discussed with mom and dad due to 8 % weight loss , mom hx of PCOs and  Limited breast changes , drops with pumping and not breast changes as of yet,  Supplementing with donor milk or formula would be indicated.  LC stressed the importance the baby needs to settled down after feedings and go to sleep. Mom and dad mentioned the baby will not settled in the crib only if they take turns holding him.  Also in the next 24 hours the wets need to increase and stools.  LC mentioned the baby can latch and a SNS tube can be inserted in the side of the baby's mouth to increase calories.  Mom mentioned she has pumped x 4 in the last 24 hours with #27 F and only drops. LC checked mom flange and the #24 F fit well. LC recommended with a dab of coconut oil on the nipple and areola to try pumping with the #24 F to see if the EBM increases and hand express.  LC also mentioned to mom [place] coverage is available tonight to call for assistance.  LC gave report to the evening LC and information above to report to the 9 p LC  To f/u.  Both mom and dad receptive to options.     Maternal Data Has patient been taught Hand Expression?: Yes  Feeding Feeding Type: Breast Fed  LATCH Score  ( Latch score was done by the [facility] )  Latch: Repeated attempts needed to  sustain latch, nipple held in mouth throughout feeding, stimulation needed to elicit sucking reflex.  Audible Swallowing: A few with stimulation  Type of Nipple: Everted at rest and after stimulation  Comfort (Breast/Nipple): Soft / non-tender  Hold (Positioning): No assistance needed to correctly position infant at breast.  LATCH Score: 8  Interventions Interventions: Breast feeding basics reviewed  Lactation Tools Discussed/Used Tools: Shells;Pump Nipple shield size: (per mom not using the NS for latching) Flange Size: 27;24(LC sized mom and the #24 F fits well . LC recommended trying the # 24 when DEBP) Shell Type: Inverted Breast pump type: Double-Electric Breast Pump;Manual   Consult Status Consult Status: Follow-up Date: 11/03/19 Follow-up type: In-patient    [place] 11/03/2019, 7:11 PM

## 2019-11-03 NOTE — Progress Notes (Signed)
Patient ID: Donna Marquez, female   DOB: Mar 28, 1991, 28 y.o.   MRN: [id number] Lactation requested baby circumcision be delayed until AM and patient stay to work on breastfeeding

## 2019-11-03 NOTE — Progress Notes (Signed)
Post Partum Day 1 Subjective: no complaints, up ad lib and tolerating PO  Baby latching well.  Hoping for d/c home this PM.  Using tylenol and oxycodone  Objective: Blood pressure 120/73, pulse 77, temperature 98.3 F (36.8 C), temperature source Oral, resp. rate 18, height 5\' 6"  (1.676 m), weight 112.1 kg, last menstrual period 12/29/2018, SpO2 98 %, unknown if currently breastfeeding.  Physical Exam:  General: alert and cooperative Lochia: appropriate Uterine Fundus: firm   Recent Labs    11/01/19 2023 11/02/19 0542  HGB 11.1* 9.5*  HCT 34.2* 29.3*    Assessment/Plan: Discharge home if baby ready to go Desires circumcision prior to d/c and reviewed with patient.  Will do at lunchtime for potential d/c this PM   LOS: 2 days   Logan Bores 11/03/2019, 8:58 AM

## 2019-11-04 MED ORDER — MEASLES, MUMPS & RUBELLA VAC IJ SOLR
0.5000 mL | Freq: Once | INTRAMUSCULAR | Status: AC
  Administered 2019-11-04: 0.5 mL via SUBCUTANEOUS
  Filled 2019-11-04: qty 0.5

## 2019-11-04 MED ORDER — ACETAMINOPHEN-CODEINE #3 300-30 MG PO TABS: 1.0000 | ORAL_TABLET | ORAL | 0 refills | Status: AC | PRN

## 2019-11-04 NOTE — Discharge Summary (Signed)
OB Discharge Summary     Patient Name: Donna Marquez DOB: 1991/08/23 MRN: [id number]  Date of admission: 11/01/2019 Delivering MD: Eula Flax M   Date of discharge: 11/04/2019  Admitting diagnosis: CTX  Intrauterine pregnancy: [redacted]w[redacted]d     Secondary diagnosis:  Active Problems:   [redacted] weeks gestation of pregnancy  Additional problems: none     Discharge diagnosis: Term Pregnancy Delivered                                                                                                Post partum procedures:none  Augmentation: AROM and Pitocin  Complications: None  Hospital course:  Onset of Labor With Vaginal Delivery     28 y.o. yo G1P1001 at [redacted]w[redacted]d was admitted in Latent Labor on 11/01/2019. Patient had an uncomplicated labor course as follows:  Membrane Rupture Time/Date: 11:30 PM ,11/01/2019   Intrapartum Procedures: Episiotomy: None [1]                                         Lacerations:  Labial [10];1st degree [2];Periurethral [8]  Patient had a delivery of a Viable infant. 11/02/2019  Information for the patient's newborn:  Rekisha, Welling [[id number]]  Delivery Method: Vaginal, Vacuum (Extractor)(Filed from Delivery Summary)     Pateint had an uncomplicated postpartum course.  She is ambulating, tolerating a regular diet, passing flatus, and urinating well. Patient is discharged home in stable condition on 11/04/19.   Physical exam  Vitals:   11/03/19 0500 11/03/19 1508 11/03/19 2210 11/04/19 0452  BP: 120/73 118/61 114/81 116/78  Pulse: 77 74 73 72  Resp: 18 16 14 16   Temp: 98.3 F (36.8 C) 98.1 F (36.7 C) 98.8 F (37.1 C) 98.4 F (36.9 C)  TempSrc: Oral Oral Oral Oral  SpO2: 98%  100% 100%  Weight:      Height:       General: alert, cooperative and no distress Lochia: appropriate Uterine Fundus: firm Incision: N/A DVT Evaluation: No evidence of DVT seen on physical exam. Labs: Lab Results  Component Value Date   WBC 14.5 (H) 11/02/2019   HGB 9.5 (L) 11/02/2019   HCT 29.3 (L) 11/02/2019   MCV 86.4 11/02/2019   PLT 252 11/02/2019   CMP Latest Ref Rng & Units 09/23/2019  Glucose 70 - 99 mg/dL 84  BUN 6 - 20 mg/dL 9  Creatinine 0.44 - 1.00 mg/dL 0.58  Sodium 135 - 145 mmol/L 137  Potassium 3.5 - 5.1 mmol/L 4.1  Chloride 98 - 111 mmol/L 104  CO2 22 - 32 mmol/L 21(L)  Calcium 8.9 - 10.3 mg/dL 8.8(L)  Total Protein 6.5 - 8.1 g/dL 6.5  Total Bilirubin 0.3 - 1.2 mg/dL <0.1(L)  Alkaline Phos 38 - 126 U/L 111  AST 15 - 41 U/L 16  ALT 0 - 44 U/L 15    Discharge instruction: per After Visit Summary and "Baby and Me Booklet".  After visit meds:  Allergies as of 11/04/2019  Reactions   Ciprofloxacin Anaphylaxis, Shortness Of Breath, Swelling   Morphine Nausea And Vomiting   Nausea and vomitting   Morphine And Related Nausea And Vomiting      Medication List    STOP taking these medications   NIFEdipine 10 MG capsule Commonly known as: PROCARDIA     TAKE these medications   acetaminophen 500 MG tablet Commonly known as: TYLENOL Take 1,000 mg by mouth every 6 (six) hours as needed.   acetaminophen-codeine 300-30 MG tablet Commonly known as: TYLENOL #3 Take 1 tablet by mouth every 4 (four) hours as needed for up to 3 days for moderate pain or severe pain.   butalbital-acetaminophen-caffeine 50-325-40 MG tablet Commonly known as: FIORICET Take 1 tablet by mouth every 4 (four) hours as needed for headache.   calcium carbonate 500 MG chewable tablet Commonly known as: TUMS - dosed in mg elemental calcium Chew 2 tablets by mouth daily.   cetirizine 10 MG tablet Commonly known as: ZYRTEC Take 10 mg by mouth once.   diphenhydrAMINE 25 MG tablet Commonly known as: BENADRYL Take 25 mg by mouth every 6 (six) hours as needed.   ferrous sulfate 325 (65 FE) MG tablet Take 325 mg by mouth daily with breakfast.   prenatal multivitamin Tabs tablet Take 1 tablet by mouth daily at 12 noon.       Diet: routine  diet  Activity: Advance as tolerated. Pelvic rest for 6 weeks.   Outpatient follow up:6 weeks Follow up Appt:No future appointments. Follow up Visit:No follow-ups on file.  Postpartum contraception: Not Discussed  Newborn Data: Live born female  Birth Weight: 7 lb 9 oz (3430 g) APGAR: 9, 9  Newborn Delivery   Birth date/time: 11/02/2019 01:02:00 Delivery type: Vaginal, Vacuum (Extractor)      Baby Feeding: Bottle and Breast Disposition:home with mother; possible room in tonight   11/04/2019 Cathrine Muster, DO

## 2019-11-04 NOTE — Discharge Instructions (Signed)
Call office with any concerns [phone] 

## 2019-11-04 NOTE — Lactation Note (Signed)
This note was copied from a baby's chart. Lactation Consultation Note Baby 70 hrs old. Had 10% wt. Loss. Mom was waiting on wt. Ck. To see how much baby loss before she decided to supplement. Mom stated that she decided if he lost a lot of wt. She would supplement w/formula and not donor milk since she would be using formula when she returns to work. Mom has PCOS. Has been pumping after feeding. Mom stated baby is BF 30 min on each breast. Since baby has wt. Loss and need to conserve calories, suggested not to feed longer than 30-35 min. Total including supplementing. Gave mom bottle of Similac w/information sheet of how much according to hours of age. LC w/mom's permission hand express opposite breast that baby was BF on. No colostrum noted. Mom stated she has seen a drop. Mom is pre-pumping before latching, BF then pumping. FOB will give supplement while mom is pumping. Reviewed formula set up and handling. Mom stated has no further questions. Baby appears slightly jaundice in color. Encouraged to call for assistance or questions.  Patient Name: Donna Marquez [id number] Date: 11/04/2019 Reason for consult: Follow-up assessment;Early term 37-38.6wks;Infant weight loss Type of Endocrine Disorder?: PCOS   Maternal Data Has patient been taught Hand Expression?: Yes Does the patient have breastfeeding experience prior to this delivery?: No  Feeding Feeding Type: Breast Fed  LATCH Score Latch: Grasps breast easily, tongue down, lips flanged, rhythmical sucking.  Audible Swallowing: None  Type of Nipple: Everted at rest and after stimulation  Comfort (Breast/Nipple): Soft / non-tender  Hold (Positioning): No assistance needed to correctly position infant at breast.  LATCH Score: 8  Interventions Interventions: Breast feeding basics reviewed;DEBP;Support pillows;Skin to skin;Breast massage;Breast compression  Lactation Tools Discussed/Used Tools: Pump Shell Type:  Inverted Breast pump type: Double-Electric Breast Pump   Consult Status Consult Status: Follow-up Date: 11/05/19 Follow-up type: In-patient    Theodoro Kalata 11/04/2019, 5:33 AM

## 2019-11-04 NOTE — Progress Notes (Addendum)
Patient ID: Donna Marquez, female   DOB: 1991-07-03, 28 y.o.   MRN: [id number] Pt doing well. Pain well controlled with tylenol. Lochia mild. Started supplementing with pumps and formula - doing well. Denies any fever/chills/ CP or SOB. Ambulating and tolerating diet well VSS GEN - NAD ABD - FF EXT - no homans  A/P: PPD#2 s/p svd - stable         Discharge instructions reviewed; may room in if baby not discharged         Plan on circumcision now - discussed with parents

## 2019-11-04 NOTE — Lactation Note (Signed)
This note was copied from a baby's chart. Lactation Consultation Note  Patient Name: Donna Marquez [id number] Date: 11/04/2019 Reason for consult: Follow-up assessment;Primapara;1st time breastfeeding;Infant weight loss;Other (Comment);Early term 37-38.6wks(10 % weight loss this am and 2nd weight check had increased/ post circ - see LC note) Type of Endocrine Disorder?: PCOS  Baby is 19 hours old / post circ/ per mom the baby BF 5 mins , sleepy and supplemented with a bottle at 1240 due to medical indication of weight loss 10%  And mom hx - PCOS , limited breast changes.  Since baby recently fed at 37 and is sleeping LC unable to check a latch.  This LC had observed a latch for the previous 2 days and baby latches well the  Milk let down is just slow to come with latching and pumping. ( see previous LC notes )  LC explored a 2nd option for supplementing baby. Mom has been supplementing with a bottle. LC instructed mom and dad on the use of 9F SNS with 35 ml syringe and inserting the tube in the side of the mouth after baby is latched.  2nd option is the double SNS ( long term use ) , applied to the breast as shown and then baby latches. ( per mom I feel that option will work better for me ).  Cleaning of both SNS's reviewed.  LC recommended to enhance steady weight gain - feed the baby on the 1st breast with SNS gradually increasing volume 30 ml by 48 hours increasing to 45 ml until moms milk comes in and weight check.  Per mom going for weight check tomorrow and LC recommended asking for another weight check Monday or Tuesday of next week .   LC reviewed sore nipple and engorgement prevention and tx.  Mom has the hand pump, the DEBP kit and a DEBP at home.   LC recommended considering calling back for LC O/P once the milk comes in.     Maternal Data    Feeding Feeding Type: (MBURN updated the feedings) Nipple Type: Slow - flow  LATCH Score                    Interventions Interventions: Breast feeding basics reviewed  Lactation Tools Discussed/Used Tools: Supplemental Nutrition System;Other (comment)(LC instructed mom and dad on the use of both SNS's)   Consult Status Consult Status: Complete(mom aware of teh option to call back for [facility] O/P once her milk comes in) Date: 11/04/19    [place] 11/04/2019, 1:36 PM

## 2019-11-09 ENCOUNTER — Inpatient Hospital Stay (HOSPITAL_COMMUNITY): Payer: BC Managed Care – PPO

## 2019-11-09 ENCOUNTER — Inpatient Hospital Stay (HOSPITAL_COMMUNITY)
Admission: AD | Admit: 2019-11-09 | Payer: BC Managed Care – PPO | Source: Home / Self Care | Admitting: Obstetrics and Gynecology

## 2019-12-15 DIAGNOSIS — Z1389 Encounter for screening for other disorder: Secondary | ICD-10-CM | POA: Diagnosis not present

## 2019-12-15 DIAGNOSIS — Z3040 Encounter for surveillance of contraceptives, unspecified: Secondary | ICD-10-CM | POA: Diagnosis not present

## 2019-12-15 DIAGNOSIS — Z3009 Encounter for other general counseling and advice on contraception: Secondary | ICD-10-CM | POA: Diagnosis not present

## 2019-12-15 DIAGNOSIS — Z3202 Encounter for pregnancy test, result negative: Secondary | ICD-10-CM | POA: Diagnosis not present

## 2020-04-18 DIAGNOSIS — Z01419 Encounter for gynecological examination (general) (routine) without abnormal findings: Secondary | ICD-10-CM | POA: Diagnosis not present

## 2020-04-18 DIAGNOSIS — Z1151 Encounter for screening for human papillomavirus (HPV): Secondary | ICD-10-CM | POA: Diagnosis not present

## 2020-04-18 DIAGNOSIS — Z1389 Encounter for screening for other disorder: Secondary | ICD-10-CM | POA: Diagnosis not present

## 2020-04-18 DIAGNOSIS — Z124 Encounter for screening for malignant neoplasm of cervix: Secondary | ICD-10-CM | POA: Diagnosis not present

## 2020-04-18 DIAGNOSIS — N939 Abnormal uterine and vaginal bleeding, unspecified: Secondary | ICD-10-CM | POA: Diagnosis not present

## 2020-04-18 DIAGNOSIS — E282 Polycystic ovarian syndrome: Secondary | ICD-10-CM | POA: Diagnosis not present

## 2020-04-18 DIAGNOSIS — Z6839 Body mass index (BMI) 39.0-39.9, adult: Secondary | ICD-10-CM | POA: Diagnosis not present

## 2020-04-18 DIAGNOSIS — Z113 Encounter for screening for infections with a predominantly sexual mode of transmission: Secondary | ICD-10-CM | POA: Diagnosis not present

## 2020-04-18 DIAGNOSIS — Z130 Encounter for screening for diseases of the blood and blood-forming organs and certain disorders involving the immune mechanism: Secondary | ICD-10-CM | POA: Diagnosis not present

## 2020-05-01 IMAGING — CT CT ABD-PELV W/ CM
2 of 4 series · 16 of 46 positions shown, 18 images · IV contrast (APPLIED)
Comparison: 08/05/2013

CLINICAL DATA: Right lower quadrant abdominal pain.

EXAM:
CT ABDOMEN AND PELVIS WITH CONTRAST
TECHNIQUE: Multidetector CT imaging of the abdomen and pelvis was performed
using the standard protocol following bolus administration of
intravenous contrast.
CONTRAST:  100mL 8FGMGQ-266 IOPAMIDOL (8FGMGQ-266) INJECTION 61%

[Series 2: axial st · axial · 0.84mm/px · z∈[-607,-117]mm · 13 of 108 slices shown, 15 images]
[im 5/108  soft-tissue]
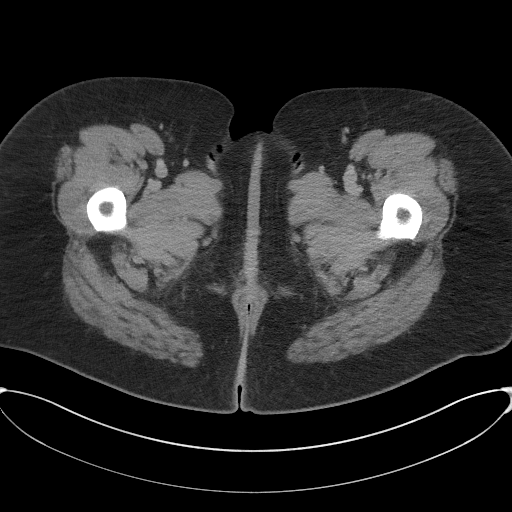
[im 5/108  bone]
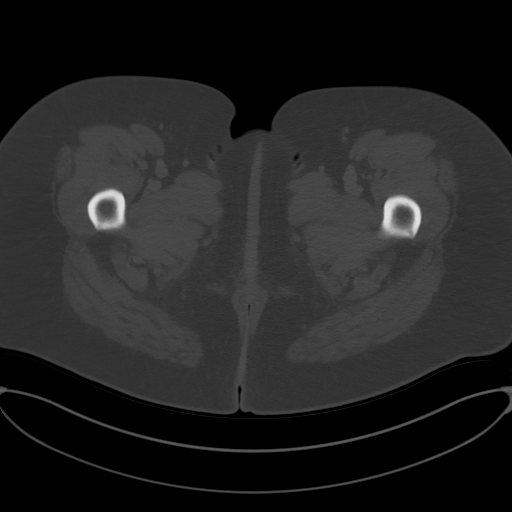
[im 14/108  soft-tissue]
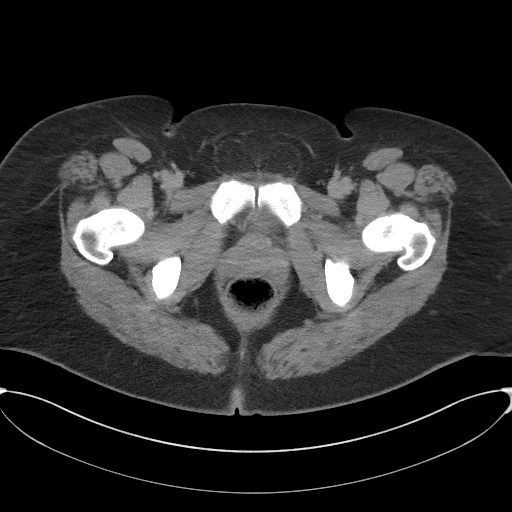
[im 23/108  soft-tissue]
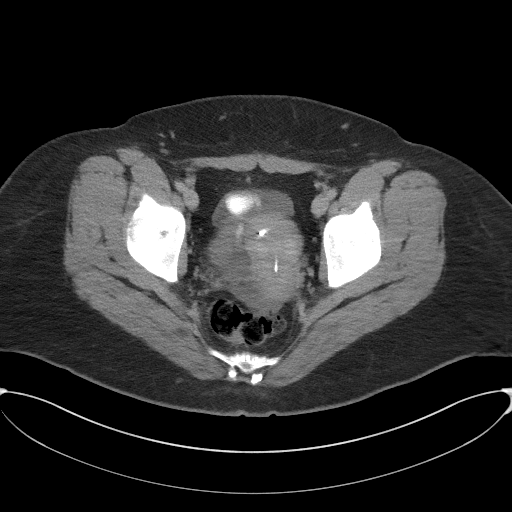
[im 32/108  soft-tissue]
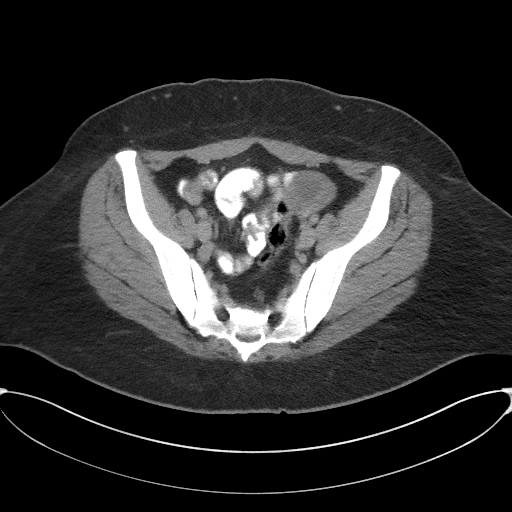
[im 36/108  soft-tissue]
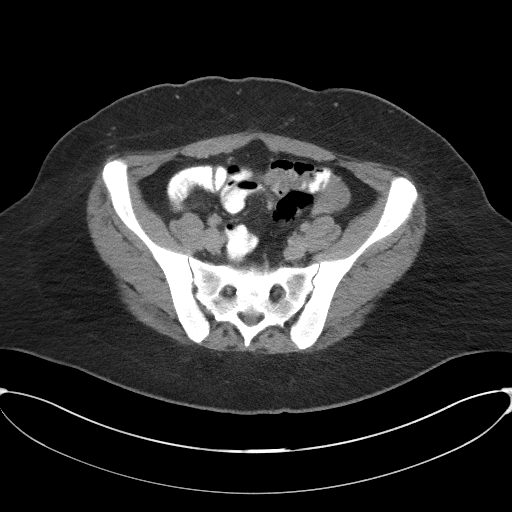
[im 45/108  soft-tissue]
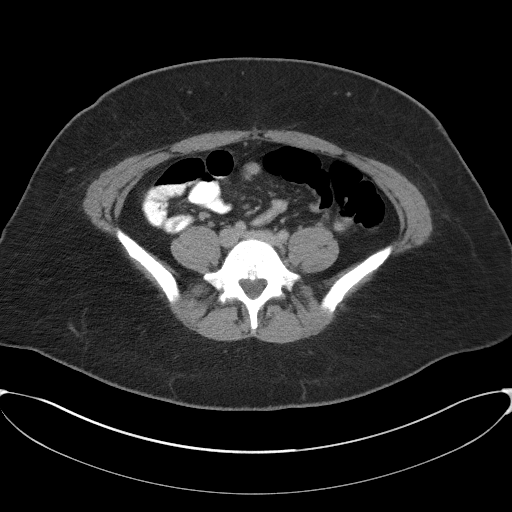
[im 54/108  soft-tissue]
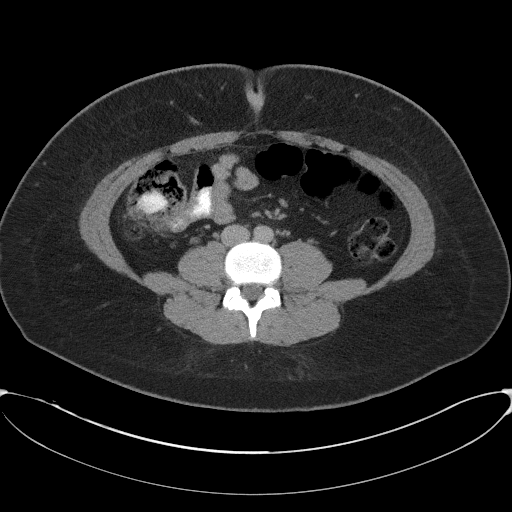
[im 63/108  soft-tissue]
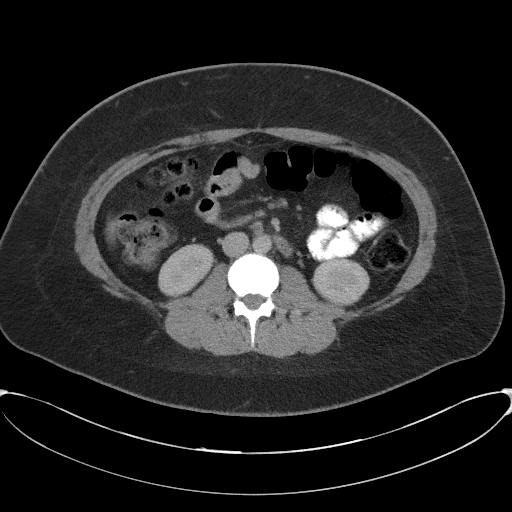
[im 72/108  soft-tissue]
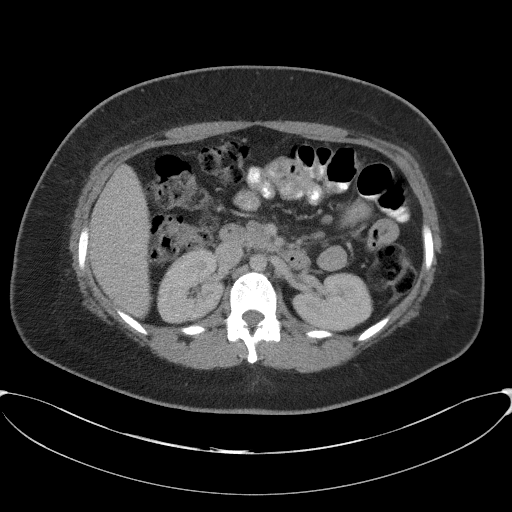
[im 72/108  bone]
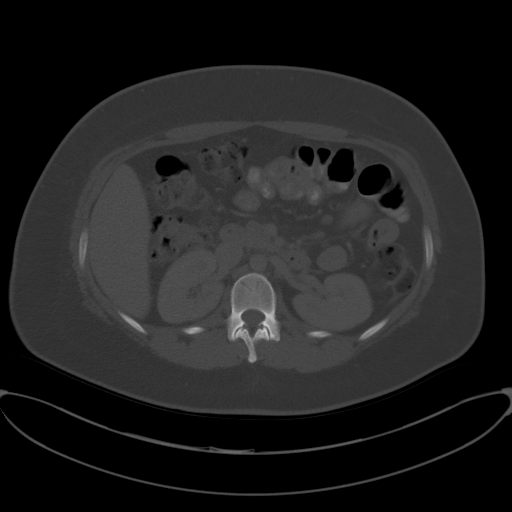
[im 76/108  soft-tissue]
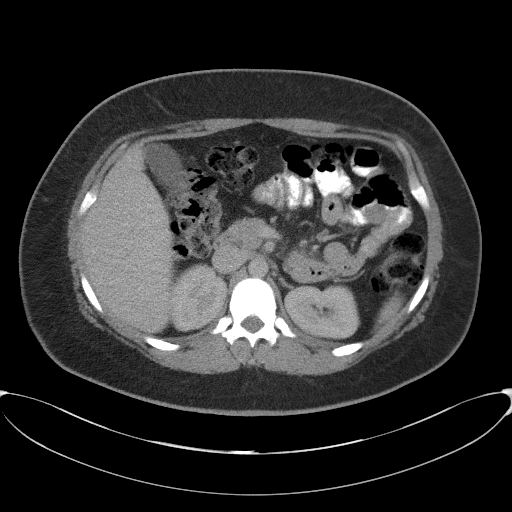
[im 85/108  soft-tissue]
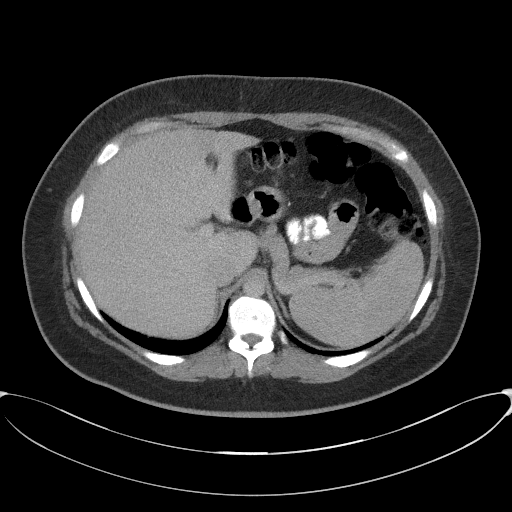
[im 94/108  soft-tissue]
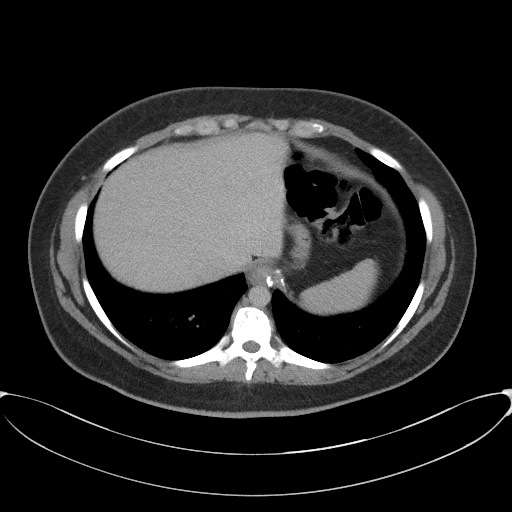
[im 103/108  soft-tissue]
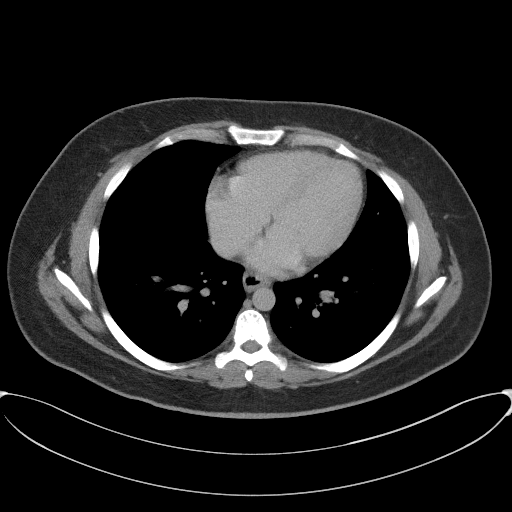

[Series 5: coronal st · coronal · 0.82mm/px · 3 of 101 slices shown]
[im 34/101  soft-tissue]
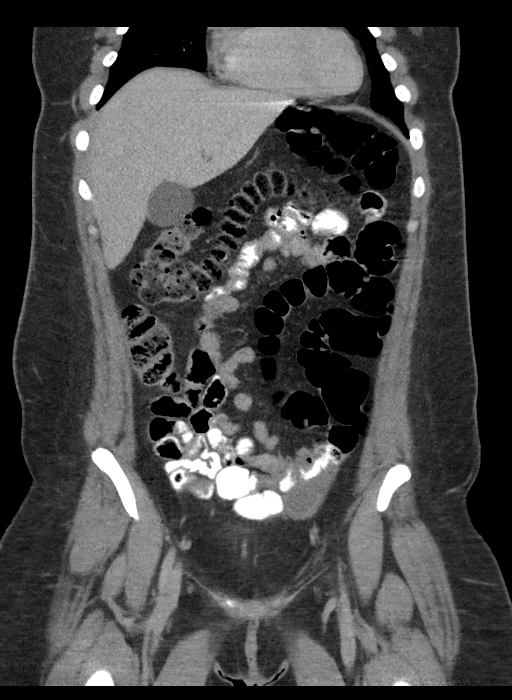
[im 45/101  soft-tissue]
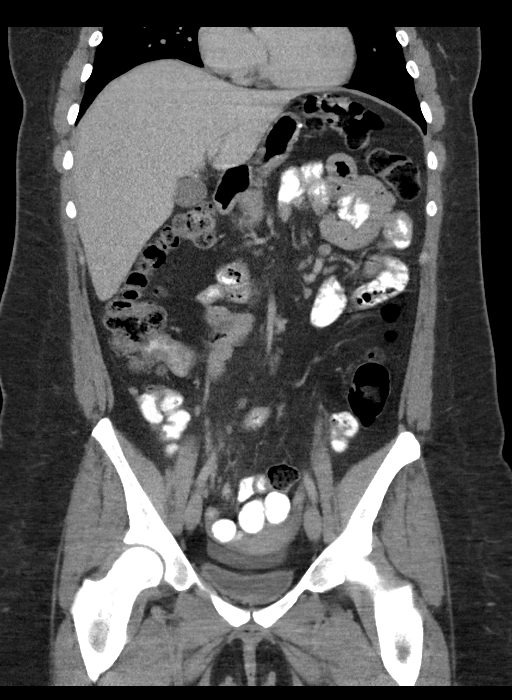
[im 56/101  soft-tissue]
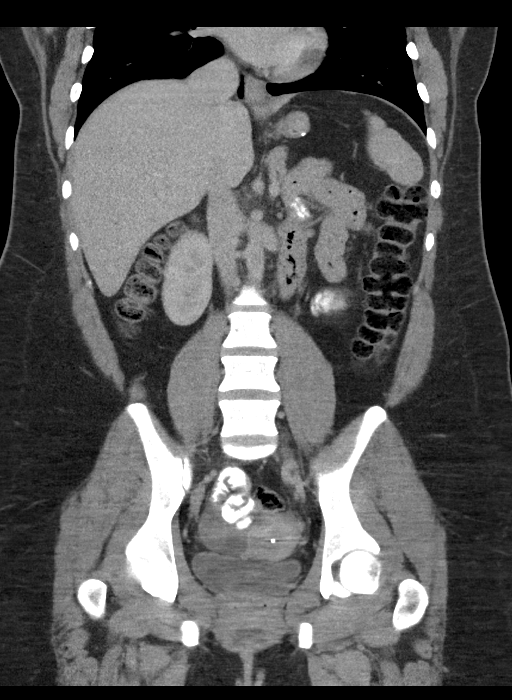

[16 of 46 positions shown; findings below may reference images not displayed]

FINDINGS: LOWER CHEST: No basilar pulmonary nodules or pleural effusion. No
apical pericardial effusion.

HEPATOBILIARY: Normal hepatic contours and density. No intra- or
extrahepatic biliary dilatation. Normal gallbladder.

PANCREAS: Normal parenchymal contours without ductal dilatation. No
peripancreatic fluid collection.

SPLEEN: Normal.

ADRENALS/URINARY TRACT:

--Adrenal glands: Normal.

--Right kidney/ureter: No hydronephrosis, nephroureterolithiasis,
perinephric stranding or solid renal mass.

--Left kidney/ureter: No hydronephrosis, nephroureterolithiasis,
perinephric stranding or solid renal mass.

--Urinary bladder: Normal for degree of distention

STOMACH/BOWEL:

--Stomach/Duodenum: Status post gastric sleeve resection. Normal
duodenal course.

--Small bowel: No dilatation or inflammation.

--Colon: No focal abnormality.

--Appendix: Normal.

VASCULAR/LYMPHATIC: Normal course and caliber of the major abdominal
vessels. No abdominal or pelvic lymphadenopathy.

REPRODUCTIVE: Normal uterus and ovaries. Contraceptive device within
the uterus.

MUSCULOSKELETAL. No bony spinal canal stenosis or focal osseous
abnormality.

OTHER: None.
IMPRESSION: Normal appendix.  No acute abnormality of the abdomen or pelvis.

## 2020-05-21 ENCOUNTER — Ambulatory Visit (INDEPENDENT_AMBULATORY_CARE_PROVIDER_SITE_OTHER): Payer: Self-pay | Admitting: Family Medicine

## 2020-05-23 ENCOUNTER — Encounter (INDEPENDENT_AMBULATORY_CARE_PROVIDER_SITE_OTHER): Payer: Self-pay | Admitting: Family Medicine

## 2020-05-23 ENCOUNTER — Other Ambulatory Visit: Payer: Self-pay

## 2020-05-23 ENCOUNTER — Ambulatory Visit (INDEPENDENT_AMBULATORY_CARE_PROVIDER_SITE_OTHER): Payer: BC Managed Care – PPO | Admitting: Family Medicine

## 2020-05-23 VITALS — BP 103/70 | HR 84 | Temp 98.0°F | Ht 65.0 in | Wt 240.0 lb

## 2020-05-23 DIAGNOSIS — R0602 Shortness of breath: Secondary | ICD-10-CM

## 2020-05-23 DIAGNOSIS — R5383 Other fatigue: Secondary | ICD-10-CM | POA: Diagnosis not present

## 2020-05-23 DIAGNOSIS — Z1331 Encounter for screening for depression: Secondary | ICD-10-CM | POA: Diagnosis not present

## 2020-05-23 DIAGNOSIS — Z9189 Other specified personal risk factors, not elsewhere classified: Secondary | ICD-10-CM

## 2020-05-23 DIAGNOSIS — Z0289 Encounter for other administrative examinations: Secondary | ICD-10-CM

## 2020-05-23 DIAGNOSIS — R7303 Prediabetes: Secondary | ICD-10-CM | POA: Diagnosis not present

## 2020-05-23 DIAGNOSIS — D508 Other iron deficiency anemias: Secondary | ICD-10-CM | POA: Diagnosis not present

## 2020-05-23 DIAGNOSIS — Z6839 Body mass index (BMI) 39.0-39.9, adult: Secondary | ICD-10-CM

## 2020-05-25 LAB — T3, FREE: T3, Free: 3 pg/mL (ref 2.0–4.4)

## 2020-05-25 LAB — CBC WITH DIFFERENTIAL/PLATELET
Basophils Absolute: 0.1 10*3/uL (ref 0.0–0.2)
Basos: 1 %
EOS (ABSOLUTE): 0.1 10*3/uL (ref 0.0–0.4)
Eos: 1 %
Hemoglobin: 11.7 g/dL (ref 11.1–15.9)
Immature Grans (Abs): 0 10*3/uL (ref 0.0–0.1)
Immature Granulocytes: 0 %
Lymphocytes Absolute: 2.1 10*3/uL (ref 0.7–3.1)
Lymphs: 36 %
MCH: 24.5 pg — ABNORMAL LOW (ref 26.6–33.0)
MCHC: 31.1 g/dL — ABNORMAL LOW (ref 31.5–35.7)
MCV: 79 fL (ref 79–97)
Monocytes Absolute: 0.5 10*3/uL (ref 0.1–0.9)
Monocytes: 9 %
Neutrophils Absolute: 3.1 10*3/uL (ref 1.4–7.0)
Neutrophils: 53 %
Platelets: 396 10*3/uL (ref 150–450)
RBC: 4.78 x10E6/uL (ref 3.77–5.28)
RDW: 16.1 % — ABNORMAL HIGH (ref 11.7–15.4)
WBC: 5.8 10*3/uL (ref 3.4–10.8)

## 2020-05-25 LAB — LIPID PANEL
Chol/HDL Ratio: 2.9 ratio (ref 0.0–4.4)
Cholesterol, Total: 184 mg/dL (ref 100–199)
HDL: 64 mg/dL (ref >39)
LDL Chol Calc (NIH): 104 mg/dL — ABNORMAL HIGH (ref 0–99)
Triglycerides: 91 mg/dL (ref 0–149)
VLDL Cholesterol Cal: 16 mg/dL (ref 5–40)

## 2020-05-25 LAB — COMPREHENSIVE METABOLIC PANEL
ALT: 9 IU/L (ref 0–32)
AST: 13 IU/L (ref 0–40)
Albumin/Globulin Ratio: 2 (ref 1.2–2.2)
Albumin: 4.7 g/dL (ref 3.9–5.0)
Alkaline Phosphatase: 107 IU/L (ref 48–121)
BUN/Creatinine Ratio: 13 (ref 9–23)
BUN: 9 mg/dL (ref 6–20)
Bilirubin Total: 0.4 mg/dL (ref 0.0–1.2)
CO2: 25 mmol/L (ref 20–29)
Calcium: 9.6 mg/dL (ref 8.7–10.2)
Chloride: 100 mmol/L (ref 96–106)
Creatinine, Ser: 0.71 mg/dL (ref 0.57–1.00)
GFR calc Af Amer: 134 mL/min/{1.73_m2} (ref >59)
GFR calc non Af Amer: 116 mL/min/{1.73_m2} (ref >59)
Globulin, Total: 2.4 g/dL (ref 1.5–4.5)
Glucose: 90 mg/dL (ref 65–99)
Potassium: 4.5 mmol/L (ref 3.5–5.2)
Sodium: 138 mmol/L (ref 134–144)
Total Protein: 7.1 g/dL (ref 6.0–8.5)

## 2020-05-25 LAB — ANEMIA PANEL
Ferritin: 7 ng/mL — ABNORMAL LOW (ref 15–150)
Folate, Hemolysate: 285 ng/mL
Folate, RBC: 758 ng/mL (ref >498)
Hematocrit: 37.6 % (ref 34.0–46.6)
Iron Saturation: 10 % — ABNORMAL LOW (ref 15–55)
Iron: 44 ug/dL (ref 27–159)
Retic Ct Pct: 1.1 % (ref 0.6–2.6)
Total Iron Binding Capacity: 431 ug/dL (ref 250–450)
UIBC: 387 ug/dL (ref 131–425)
Vitamin B-12: 676 pg/mL (ref 232–1245)

## 2020-05-25 LAB — TSH: TSH: 1.64 u[IU]/mL (ref 0.450–4.500)

## 2020-05-25 LAB — PREALBUMIN: PREALBUMIN: 25 mg/dL (ref 14–35)

## 2020-05-25 LAB — HEMOGLOBIN A1C
Est. average glucose Bld gHb Est-mCnc: 108 mg/dL
Hgb A1c MFr Bld: 5.4 % (ref 4.8–5.6)

## 2020-05-25 LAB — ZINC: Zinc: 117 ug/dL — ABNORMAL HIGH (ref 44–115)

## 2020-05-25 LAB — INSULIN, RANDOM: INSULIN: 13.2 u[IU]/mL (ref 2.6–24.9)

## 2020-05-25 LAB — FOLATE: Folate: >20 ng/mL (ref >3.0)

## 2020-05-25 LAB — VITAMIN D 25 HYDROXY (VIT D DEFICIENCY, FRACTURES): Vit D, 25-Hydroxy: 18.5 ng/mL — ABNORMAL LOW (ref 30.0–100.0)

## 2020-05-25 LAB — T4, FREE: Free T4: 1.1 ng/dL (ref 0.82–1.77)

## 2020-05-28 NOTE — Progress Notes (Signed)
Dear Donna Monday, MD,   Thank you for referring Donna Marquez to our clinic. The following note includes my evaluation and treatment recommendations.  Chief Complaint:   OBESITY Donna Marquez (MR# [id number]) is a 29 y.o. female who presents for evaluation and treatment of obesity and related comorbidities. Current BMI is Body mass index is 39.94 kg/m. Donna Marquez has been struggling with her weight for many years and has been unsuccessful in either losing weight, maintaining weight loss, or reaching her healthy weight goal.  Donna Marquez has a history of gastric sleeve in 2018, with minimal restriction if any. For breakfast she is doing coffee with unsweetened [employer] and sugar free vanilla (2-3 tablespoons), with 1 pack of peanut butter crackers, or a protein bar or Oik[employer] yogurt (feels ful) for 30 minutes. Then its lifesavers mint. For Lunch she is doing protein shake or leftovers (3-4 oz of meat and veggies) (feels full) for 1 hour. Her afternoon snack is cheese stick or fruit. For dinner she is doing chicken in the air fryer and roasted veggies or baked spaghetti with pasta zero noodles.  Donna Marquez is currently in the action stage of change and ready to dedicate time achieving and maintaining a healthier weight. Donna Marquez is interested in becoming our patient and working on intensive lifestyle modifications including (but not limited to) diet and exercise for weight loss.  Donna Marquez's habits were reviewed today and are as follows: Her family eats meals together, she thinks her family will eat healthier with her, her desired weight loss is 60-65 lbs, she has been heavy most of her life, she started gaining weight around 29 years old, her heaviest weight ever was 315 pounds, she has significant food cravings issues, she skips meals frequently, she is frequently drinking liquids with calories, she frequently makes poor food choices, she has problems with excessive hunger, she frequently eats larger portions  than normal and she struggles with emotional eating.  Depression Screen Donna Marquez's Food and Mood (modified PHQ-9) score was 12.  Depression screen PHQ 2/9 05/23/2020  Decreased Interest 1  Down, Depressed, Hopeless 2  PHQ - 2 Score 3  Altered sleeping 2  Tired, decreased energy 2  Change in appetite 1  Feeling bad or failure about yourself  1  Trouble concentrating 2  Moving slowly or fidgety/restless 1  Suicidal thoughts 0  PHQ-9 Score 12  Difficult doing work/chores Somewhat difficult   Subjective:   1. Other fatigue Donna Marquez admits to daytime somnolence and admits to waking up still tired. Patent has a history of symptoms of daytime fatigue. Donna Marquez generally gets 5 or 7 hours of sleep per night, and states that she has nightime awakenings. Snoring is present. Apneic episodes are not present. Epworth Sleepiness Score is 7. EKG-normal sinus rhythm at 77 BPM.  2. SOB (shortness of breath) on exertion Donna Marquez notes increasing shortness of breath with exercising and seems to be worsening over time with weight gain. She notes getting out of breath sooner with activity than she used to. This has not gotten worse recently. Donna Marquez denies shortness of breath at rest or orthopnea.  3. Pre-diabetes Donna Marquez's last A1c in 2018 was 5.8. she has a history of polycystic ovarian syndrome.  4. Other iron deficiency anemia Donna Marquez is on daily multivitamins and iron supplementations. She has a history of bariatric surgery.  5. At risk for diabetes mellitus Donna Marquez is at higher than average risk for developing diabetes due to her obesity.   Assessment/Plan:   1. Other fatigue  Donna Marquez does feel that her weight is causing her energy to be lower than it should be. Fatigue may be related to obesity, depression or many other causes. Labs will be ordered, and in the meanwhile, Donna Marquez will focus on self care including making healthy food choices, increasing physical activity and focusing on stress reduction.  -  EKG 12-Lead - Zinc - Prealbumin - T3, free - T4, free - TSH - Vitamin B12 - VITAMIN D 25 Hydroxy (Vit-D Deficiency, Fractures) - Folate - Lipid panel - Hemoglobin A1c - Insulin, random  - Comprehensive metabolic panel  2. SOB (shortness of breath) on exertion Donna Marquez does feel that she gets out of breath more easily that she used to when she exercises. Donna Marquez's shortness of breath appears to be obesity related and exercise induced. She has agreed to work on weight loss and gradually increase exercise to treat her exercise induced shortness of breath. Will continue to monitor closely.  3. Pre-diabetes Donna Marquez will continue to work on weight loss, exercise, and decreasing simple carbohydrates to help decrease the risk of diabetes. We will check labs today.  4. Other iron deficiency anemia We will check labs today. Orders and follow up as documented in patient record.  Counseling . Iron is essential for our bodies to make red blood cells.  Reasons that someone may be deficient include: an iron-deficient diet (more likely in those following vegan or vegetarian diets), women with heavy menses, patients with GI disorders or poor absorption, patients that have had bariatric surgery, frequent blood donors, patients with cancer, and patients with heart disease.   Donna Marquez foods include dark leafy greens, red and white meats, eggs, seafood, and beans.   . Certain foods and drinks prevent your body from absorbing iron properly. Avoid eating these foods in the same meal as iron-rich foods or with iron supplements. These foods include: coffee, black tea, and red wine; milk, dairy products, and foods that are high in calcium; beans and soybeans; whole grains.  . Constipation can be a side effect of iron supplementation. Increased water and fiber intake are helpful. Water goal: > 2 liters/day. Fiber goal: > 25 grams/day. .  - CBC with Differential/Platelet - Anemia panel  5. Depression  screening Donna Marquez had a positive depression screening. Depression is commonly associated with obesity and often results in emotional eating behaviors. We will monitor this closely and work on CBT to help improve the non-hunger eating patterns. Referral to Psychology may be required if no improvement is seen as she continues in our clinic.  6. At risk for diabetes mellitus Donna Marquez was given approximately 15 minutes of diabetes education and counseling today. We discussed intensive lifestyle modifications today with an emphasis on weight loss as well as increasing exercise and decreasing simple carbohydrates in her diet. We also reviewed medication options with an emphasis on risk versus benefit of those discussed.   Repetitive spaced learning was employed today to elicit superior memory formation and behavioral change.  7. Class 2 severe obesity with serious comorbidity and body mass index (BMI) of 39.0 to 39.9 in adult, unspecified obesity type (HCC) Donna Marquez is currently in the action stage of change and her goal is to continue with weight loss efforts. I recommend Donna Marquez begin the structured treatment plan as follows:  She has agreed to the Category 3 Plan.  Exercise goals: No exercise has been prescribed at this time.   Behavioral modification strategies: increasing lean protein intake, meal planning and cooking strategies, keeping healthy foods  in the home and planning for success.  She was informed of the importance of frequent follow-up visits to maximize her success with intensive lifestyle modifications for her multiple health conditions. She was informed we would discuss her lab results at her next visit unless there is a critical issue that needs to be addressed sooner. Donna Marquez agreed to keep her next visit at the agreed upon time to discuss these results.  Objective:   Blood pressure 103/70, pulse 84, temperature 98 F (36.7 C), temperature source Oral, height 5\' 5"  (1.651 m), weight 240  lb (108.9 kg), last menstrual period 04/19/2020, SpO2 99 %, unknown if currently breastfeeding. Body mass index is 39.94 kg/m.  EKG: Normal sinus rhythm, rate 77 BPM.  Indirect Calorimeter completed today shows a VO2 of 259 and a REE of 1802.  Her calculated basal metabolic rate is 5366 thus her basal metabolic rate is worse than expected.  General: Cooperative, alert, well developed, in no acute distress. HEENT: Conjunctivae and lids unremarkable. Cardiovascular: Regular rhythm.  Lungs: Normal work of breathing. Neurologic: No focal deficits.   Lab Results  Component Value Date   CREATININE 0.71 05/23/2020   BUN 9 05/23/2020   NA 138 05/23/2020   K 4.5 05/23/2020   CL 100 05/23/2020   CO2 25 05/23/2020   Lab Results  Component Value Date   ALT 9 05/23/2020   AST 13 05/23/2020   ALKPHOS 107 05/23/2020   BILITOT 0.4 05/23/2020   Lab Results  Component Value Date   HGBA1C 5.4 05/23/2020   Lab Results  Component Value Date   INSULIN 13.2 05/23/2020   Lab Results  Component Value Date   TSH 1.640 05/23/2020   Lab Results  Component Value Date   CHOL 184 05/23/2020   HDL 64 05/23/2020   LDLCALC 104 (H) 05/23/2020   TRIG 91 05/23/2020   CHOLHDL 2.9 05/23/2020   Lab Results  Component Value Date   WBC 5.8 05/23/2020   HGB 11.7 05/23/2020   HCT 37.6 05/23/2020   MCV 79 05/23/2020   PLT 396 05/23/2020   Lab Results  Component Value Date   IRON 44 05/23/2020   TIBC 431 05/23/2020   FERRITIN 7 (L) 05/23/2020   Attestation Statements:   This is the patient's first visit at [facility]. The patient's NEW PATIENT PACKET was reviewed at length. Included in the packet: current and past health history, medications, allergies, ROS, gynecologic history (women only), surgical history, family history, social history, weight history, weight loss surgery history (for those that have had weight loss surgery), nutritional evaluation, mood and food  questionnaire, PHQ9, Epworth questionnaire, sleep habits questionnaire, patient life and health improvement goals questionnaire. These will all be scanned into the patient's chart under media.   During the visit, I independently reviewed the patient's EKG, bioimpedance scale results, and indirect calorimeter results. I used this information to tailor a meal plan for the patient that will help her to lose weight and will improve her obesity-related conditions going forward. I performed a medically necessary appropriate examination and/or evaluation. I discussed the assessment and treatment plan with the patient. The patient was provided an opportunity to ask questions and all were answered. The patient agreed with the plan and demonstrated an understanding of the instructions. Labs were ordered at this visit and will be reviewed at the next visit unless more critical results need to be addressed immediately. Clinical information was updated and documented in the EMR.   Time spent  on visit including pre-visit chart review and post-visit care was 45 minutes.   A separate 15 minutes was spent on risk counseling (see above).     I, Burt Knack, am acting as transcriptionist for Reuben Likes, MD.  I have reviewed the above documentation for accuracy and completeness, and I agree with the above. - Donna Mires, MD

## 2020-06-06 ENCOUNTER — Ambulatory Visit (INDEPENDENT_AMBULATORY_CARE_PROVIDER_SITE_OTHER): Payer: Self-pay | Admitting: Family Medicine

## 2020-06-06 ENCOUNTER — Ambulatory Visit (INDEPENDENT_AMBULATORY_CARE_PROVIDER_SITE_OTHER): Payer: BC Managed Care – PPO | Admitting: Family Medicine

## 2020-06-06 ENCOUNTER — Other Ambulatory Visit: Payer: Self-pay

## 2020-06-06 ENCOUNTER — Encounter (INDEPENDENT_AMBULATORY_CARE_PROVIDER_SITE_OTHER): Payer: Self-pay | Admitting: Family Medicine

## 2020-06-06 VITALS — BP 111/68 | HR 88 | Temp 98.0°F | Ht 65.0 in | Wt 235.0 lb

## 2020-06-06 DIAGNOSIS — R7303 Prediabetes: Secondary | ICD-10-CM

## 2020-06-06 DIAGNOSIS — E7849 Other hyperlipidemia: Secondary | ICD-10-CM

## 2020-06-06 DIAGNOSIS — Z6839 Body mass index (BMI) 39.0-39.9, adult: Secondary | ICD-10-CM

## 2020-06-06 DIAGNOSIS — E559 Vitamin D deficiency, unspecified: Secondary | ICD-10-CM

## 2020-06-06 DIAGNOSIS — Z9189 Other specified personal risk factors, not elsewhere classified: Secondary | ICD-10-CM

## 2020-06-06 MED ORDER — VITAMIN D (ERGOCALCIFEROL) 1.25 MG (50000 UNIT) PO CAPS: 50000.0000 [IU] | ORAL_CAPSULE | ORAL | 0 refills | Status: DC

## 2020-06-07 NOTE — Progress Notes (Signed)
Chief Complaint:   OBESITY Donna Marquez is here to discuss her progress with her obesity treatment plan along with follow-up of her obesity related diagnoses. Donna Marquez is on the Category 3 Plan and states she is following her eating plan approximately 70% of the time. Donna Marquez states she is doing a lot of walking and taking the stairs 6 times per week.  Today's visit was #: 2 Starting weight: 240 lbs Starting date: 05/23/2020 Today's weight: 235 lbs Today's date: 06/06/2020 Total lbs lost to date: 5 Total lbs lost since last in-office visit: 5  Interim History: Donna Marquez went to the beach  Over the last few weeks. While away, she tried to stick to the plan as close as possible. She got back yesterday. She did notice she wasn't able to eat all of the food at once at lunch. She is getting in 7-8 oz of protein at dinner. She is craving ice cream.  Subjective:   1. Other hyperlipidemia Donna Marquez's last LDL was 104, HDl 64, and triglycerides 91. She is not on medications. I discussed labs with the patient today.  2. Vitamin D deficiency Donna Marquez is not on Vit D supplementation, and she notes fatigue. I discussed labs with the patient today.  3. Pre-diabetes Donna Marquez's last A1c was 5.4 and insulin 13.2. She is on metformin and denies GI side effects. I discussed labs with the patient today.  4. At risk for osteoporosis Donna Marquez is at higher risk of osteopenia and osteoporosis due to Vitamin D deficiency.   Assessment/Plan:   1. Other hyperlipidemia Cardiovascular risk and specific lipid/LDL goals reviewed. We discussed several lifestyle modifications today and Reigna will continue to work on diet, exercise and weight loss efforts. We will follow up on labs in 3 months. Orders and follow up as documented in patient record.   Counseling Intensive lifestyle modifications are the first line treatment for this issue. . Dietary changes: Increase soluble fiber. Decrease simple carbohydrates. . Exercise  changes: Moderate to vigorous-intensity aerobic activity 150 minutes per week if tolerated. . Lipid-lowering medications: see documented in medical record.  2. Vitamin D deficiency Low Vitamin D level contributes to fatigue and are associated with obesity, breast, and colon cancer. Danylle agreed to start prescription Vitamin D 50,000 IU every week with no refills. She will follow-up for routine testing of Vitamin D, at least 2-3 times per year to avoid over-replacement.  - Vitamin D, Ergocalciferol, (DRISDOL) 1.25 MG (50000 UNIT) CAPS capsule; Take 1 capsule (50,000 Units total) by mouth every 7 (seven) days.  Dispense: 4 capsule; Refill: 0  3. Pre-diabetes Donna Marquez will continue to work on weight loss, exercise, and decreasing simple carbohydrates to help decrease the risk of diabetes. We will repeat labs in 3 months.  4. At risk for osteoporosis Donna Marquez was given approximately 30 minutes of osteoporosis prevention counseling today. Donna Marquez is at risk for osteopenia and osteoporosis due to her Vitamin D deficiency. She was encouraged to take her Vitamin D and follow her higher calcium diet and increase strengthening exercise to help strengthen her bones and decrease her risk of osteopenia and osteoporosis.  Repetitive spaced learning was employed today to elicit superior memory formation and behavioral change.  5. Class 2 severe obesity with serious comorbidity and body mass index (BMI) of 39.0 to 39.9 in adult, unspecified obesity type (HCC) Donna Marquez is currently in the action stage of change. As such, her goal is to continue with weight loss efforts. She has agreed to the Category 3 Plan.  Exercise goals: As is.  Behavioral modification strategies: increasing lean protein intake, increasing vegetables, meal planning and cooking strategies and keeping healthy foods in the home.  Donna Marquez has agreed to follow-up with our clinic in 2 weeks. She was informed of the importance of frequent follow-up  visits to maximize her success with intensive lifestyle modifications for her multiple health conditions.   Objective:   Blood pressure 111/68, pulse 88, temperature 98 F (36.7 C), temperature source Oral, height 5\' 5"  (1.651 m), weight 235 lb (106.6 kg), last menstrual period 05/29/2020, SpO2 99 %, unknown if currently breastfeeding. Body mass index is 39.11 kg/m.  General: Cooperative, alert, well developed, in no acute distress. HEENT: Conjunctivae and lids unremarkable. Cardiovascular: Regular rhythm.  Lungs: Normal work of breathing. Neurologic: No focal deficits.   Lab Results  Component Value Date   CREATININE 0.71 05/23/2020   BUN 9 05/23/2020   NA 138 05/23/2020   K 4.5 05/23/2020   CL 100 05/23/2020   CO2 25 05/23/2020   Lab Results  Component Value Date   ALT 9 05/23/2020   AST 13 05/23/2020   ALKPHOS 107 05/23/2020   BILITOT 0.4 05/23/2020   Lab Results  Component Value Date   HGBA1C 5.4 05/23/2020   Lab Results  Component Value Date   INSULIN 13.2 05/23/2020   Lab Results  Component Value Date   TSH 1.640 05/23/2020   Lab Results  Component Value Date   CHOL 184 05/23/2020   HDL 64 05/23/2020   LDLCALC 104 (H) 05/23/2020   TRIG 91 05/23/2020   CHOLHDL 2.9 05/23/2020   Lab Results  Component Value Date   WBC 5.8 05/23/2020   HGB 11.7 05/23/2020   HCT 37.6 05/23/2020   MCV 79 05/23/2020   PLT 396 05/23/2020   Lab Results  Component Value Date   IRON 44 05/23/2020   TIBC 431 05/23/2020   FERRITIN 7 (L) 05/23/2020   Attestation Statements:   Reviewed by clinician on day of visit: allergies, medications, problem list, medical history, surgical history, family history, social history, and previous encounter notes.   I, 05/25/2020, am acting as transcriptionist for Burt Knack, MD.  I have reviewed the above documentation for accuracy and completeness, and I agree with the above. - Reuben Likes, MD

## 2020-06-27 ENCOUNTER — Encounter (INDEPENDENT_AMBULATORY_CARE_PROVIDER_SITE_OTHER): Payer: Self-pay | Admitting: Family Medicine

## 2020-06-27 ENCOUNTER — Ambulatory Visit (INDEPENDENT_AMBULATORY_CARE_PROVIDER_SITE_OTHER): Payer: BC Managed Care – PPO | Admitting: Family Medicine

## 2020-06-27 ENCOUNTER — Other Ambulatory Visit: Payer: Self-pay

## 2020-06-27 VITALS — BP 105/70 | HR 69 | Temp 98.1°F | Ht 65.0 in | Wt 234.0 lb

## 2020-06-27 DIAGNOSIS — E559 Vitamin D deficiency, unspecified: Secondary | ICD-10-CM

## 2020-06-27 DIAGNOSIS — Z903 Acquired absence of stomach [part of]: Secondary | ICD-10-CM | POA: Diagnosis not present

## 2020-06-27 DIAGNOSIS — Z9189 Other specified personal risk factors, not elsewhere classified: Secondary | ICD-10-CM

## 2020-06-27 DIAGNOSIS — F3289 Other specified depressive episodes: Secondary | ICD-10-CM

## 2020-06-27 DIAGNOSIS — Z6839 Body mass index (BMI) 39.0-39.9, adult: Secondary | ICD-10-CM

## 2020-06-27 MED ORDER — BUPROPION HCL ER (SR) 150 MG PO TB12: 150.0000 mg | ORAL_TABLET | Freq: Every morning | ORAL | 0 refills | Status: DC

## 2020-06-27 MED ORDER — VITAMIN D (ERGOCALCIFEROL) 1.25 MG (50000 UNIT) PO CAPS: 50000.0000 [IU] | ORAL_CAPSULE | ORAL | 0 refills | Status: DC

## 2020-06-28 ENCOUNTER — Encounter (INDEPENDENT_AMBULATORY_CARE_PROVIDER_SITE_OTHER): Payer: Self-pay | Admitting: Family Medicine

## 2020-06-28 DIAGNOSIS — Z903 Acquired absence of stomach [part of]: Secondary | ICD-10-CM | POA: Insufficient documentation

## 2020-06-28 DIAGNOSIS — E66812 Obesity, class 2: Secondary | ICD-10-CM | POA: Insufficient documentation

## 2020-06-28 DIAGNOSIS — E559 Vitamin D deficiency, unspecified: Secondary | ICD-10-CM | POA: Insufficient documentation

## 2020-06-28 DIAGNOSIS — Z6839 Body mass index (BMI) 39.0-39.9, adult: Secondary | ICD-10-CM | POA: Insufficient documentation

## 2020-06-28 DIAGNOSIS — Z9189 Other specified personal risk factors, not elsewhere classified: Secondary | ICD-10-CM | POA: Diagnosis not present

## 2020-06-28 NOTE — Progress Notes (Signed)
Chief Complaint:   OBESITY Donna Marquez is here to discuss her progress with her obesity treatment plan along with follow-up of her obesity related diagnoses. Donna Marquez is on the Category 3 Plan and states she is following her eating plan approximately 70% of the time. Donna Marquez states she is at the gym for 45-60 minutes 3-4 times per week.  Today's visit was #: 3 Starting weight: 240 lbs Starting date: 05/23/2020 Today's weight: 234 lbs Today's date: 06/27/2020 Total lbs lost to date: 6 Total lbs lost since last in-office visit: 1  Interim History: Donna Marquez's 51 month old son is sick with respiratory syncytial virus, and she has struggled with the plan. She struggles to get the protein in at supper due to restriction from gastric sleeve in 2018.  Subjective:   1. Vitamin D deficiency Kailey's last Vit D level was low at 18.5. She is on weekly prescription Vit D.  2. Status post gastric surgery Donna Marquez still has some restriction in portion size. Her surgery was in 2018. Her highest weight was 315 lbs. Nadir- 215 lbs. She began regaining after the birth of her baby 7 months ago.  3. Other depression with emoitional eating Donna Marquez notes she eats from boredom at work and out of habit.  4. At risk for side effect of medication Donna Marquez is at risk for drug side effects due to starting bupropion.  Assessment/Plan:   1. Vitamin D deficiency Low Vitamin D level contributes to fatigue and are associated with obesity, breast, and colon cancer. We will refill prescription Vitamin D for 1 month. Donna Marquez will follow-up for routine testing of Vitamin D, at least 2-3 times per year to avoid over-replacement.  - Vitamin D, Ergocalciferol, (DRISDOL) 1.25 MG (50000 UNIT) CAPS capsule; Take 1 capsule (50,000 Units total) by mouth every 7 (seven) days.  Dispense: 4 capsule; Refill: 0  2. Status post gastric surgery Donna Marquez will continue her meal plan, and will continue to follow up as directed.  3. Other  depression with emoitional eating Behavior modification techniques were discussed today to help Donna Marquez deal with her emotional/non-hunger eating behaviors. Donna Marquez agreed to start bupropion 150 mg q AM with no refills. Orders and follow up as documented in patient record.   - buPROPion (WELLBUTRIN SR) 150 MG 12 hr tablet; Take 1 tablet (150 mg total) by mouth in the morning.  Dispense: 30 tablet; Refill: 0  4. At risk for side effect of medication Donna Marquez was given approximately 15 minutes of drug side effect counseling today.  We discussed side effect possibility and risk versus benefits. Donna Marquez agreed to the medication and will contact this office if these side effects are intolerable.  Repetitive spaced learning was employed today to elicit superior memory formation and behavioral change.  5. Class 2 severe obesity with serious comorbidity and body mass index (BMI) of 39.0 to 39.9 in adult, unspecified obesity type (HCC) Donna Marquez is currently in the action stage of change. As such, her goal is to continue with weight loss efforts. She has agreed to the Category 3 Plan.   Exercise goals: As is.  Behavioral modification strategies: increasing lean protein intake.  Donna Marquez has agreed to follow-up with our clinic in 2 weeks. She was informed of the importance of frequent follow-up visits to maximize her success with intensive lifestyle modifications for her multiple health conditions.   Objective:   Blood pressure 105/70, pulse 69, temperature 98.1 F (36.7 C), temperature source Oral, height 5\' 5"  (1.651 m), weight (!) 234  lb (106.1 kg), last menstrual period 05/29/2020, SpO2 99 %, not currently breastfeeding. Body mass index is 38.94 kg/m.  General: Cooperative, alert, well developed, in no acute distress. HEENT: Conjunctivae and lids unremarkable. Cardiovascular: Regular rhythm.  Lungs: Normal work of breathing. Neurologic: No focal deficits.   Lab Results  Component Value Date    CREATININE 0.71 05/23/2020   BUN 9 05/23/2020   NA 138 05/23/2020   K 4.5 05/23/2020   CL 100 05/23/2020   CO2 25 05/23/2020   Lab Results  Component Value Date   ALT 9 05/23/2020   AST 13 05/23/2020   ALKPHOS 107 05/23/2020   BILITOT 0.4 05/23/2020   Lab Results  Component Value Date   HGBA1C 5.4 05/23/2020   Lab Results  Component Value Date   INSULIN 13.2 05/23/2020   Lab Results  Component Value Date   TSH 1.640 05/23/2020   Lab Results  Component Value Date   CHOL 184 05/23/2020   HDL 64 05/23/2020   LDLCALC 104 (H) 05/23/2020   TRIG 91 05/23/2020   CHOLHDL 2.9 05/23/2020   Lab Results  Component Value Date   WBC 5.8 05/23/2020   HGB 11.7 05/23/2020   HCT 37.6 05/23/2020   MCV 79 05/23/2020   PLT 396 05/23/2020   Lab Results  Component Value Date   IRON 44 05/23/2020   TIBC 431 05/23/2020   FERRITIN 7 (L) 05/23/2020   Attestation Statements:   Reviewed by clinician on day of visit: allergies, medications, problem list, medical history, surgical history, family history, social history, and previous encounter notes.   Trude Mcburney, am acting as Energy manager for [place], FNP-C.  I have reviewed the above documentation for accuracy and completeness, and I agree with the above. -  Jesse Sans, FNP

## 2020-07-10 ENCOUNTER — Other Ambulatory Visit: Payer: Self-pay

## 2020-07-10 ENCOUNTER — Ambulatory Visit (INDEPENDENT_AMBULATORY_CARE_PROVIDER_SITE_OTHER): Payer: BC Managed Care – PPO | Admitting: Family Medicine

## 2020-07-10 ENCOUNTER — Encounter (INDEPENDENT_AMBULATORY_CARE_PROVIDER_SITE_OTHER): Payer: Self-pay | Admitting: Family Medicine

## 2020-07-10 VITALS — BP 107/67 | HR 74 | Temp 97.7°F | Ht 65.0 in | Wt 230.0 lb

## 2020-07-10 DIAGNOSIS — F3289 Other specified depressive episodes: Secondary | ICD-10-CM

## 2020-07-10 DIAGNOSIS — Z6838 Body mass index (BMI) 38.0-38.9, adult: Secondary | ICD-10-CM

## 2020-07-10 DIAGNOSIS — D508 Other iron deficiency anemias: Secondary | ICD-10-CM | POA: Diagnosis not present

## 2020-07-11 ENCOUNTER — Encounter (INDEPENDENT_AMBULATORY_CARE_PROVIDER_SITE_OTHER): Payer: Self-pay | Admitting: Family Medicine

## 2020-07-11 DIAGNOSIS — D649 Anemia, unspecified: Secondary | ICD-10-CM | POA: Insufficient documentation

## 2020-07-11 NOTE — Progress Notes (Signed)
Chief Complaint:   OBESITY Donna Marquez is here to discuss her progress with her obesity treatment plan along with follow-up of her obesity related diagnoses. Donna Marquez is on the Category 3 Plan and states she is following her eating plan approximately 80% of the time. Donna Marquez states she is walking and at the gym for 45 minutes 2-3 times per week.  Today's visit was #: 4 Starting weight: 240 lbs Starting date: 05/23/2020 Today's weight: 230 lbs Today's date: 07/10/2020 Total lbs lost to date: 10 Total lbs lost since last in-office visit: 4  Interim History: Donna Marquez has gotten more protein in by using dairy to substitute for some of the meat. She feels her hunger is satisfied. She does still struggle to get all of the food in due to hx of sleeve gastrectomy in 2018. She is a bit bored with breakfast options.  Subjective:   1. Other iron deficiency anemia Donna Marquez is on 325 mg iron daily and has ben compliant recently. Last hemoglobin was 11.7 up from 9.5 on 11/02/2019. Her ferritin and iron sat are also low. She has had sleeve gastrectomy in 2018.  2. Other depression with emotional eating Donna Marquez's bupropion was added at her last office visit, and she feels this is helping with cravings. She denies insomnia or dry mouth.  Assessment/Plan:   1. Other iron deficiency anemia Savhanna will continue iron 325 mg daily and we will recheck CBC and anemia profile in 2 months.   2. Other depression with emotional eating Behavior modification techniques were discussed today to help Donna Marquez deal with her emotional/non-hunger eating behaviors. Donna Marquez will continue bupropion. Orders and follow up as documented in patient record.   3. Class 2 severe obesity with serious comorbidity and body mass index (BMI) of 38.0 to 38.9 in adult, unspecified obesity type (HCC) Donna Marquez is currently in the action stage of change. As such, her goal is to continue with weight loss efforts. She has agreed to the Category 3 Plan  with additional breakfast options.   Exercise goals: As is.  Behavioral modification strategies: increasing lean protein intake.  Donna Marquez has agreed to follow-up with our clinic in 2 weeks. She was informed of the importance of frequent follow-up visits to maximize her success with intensive lifestyle modifications for her multiple health conditions.   Objective:   Blood pressure 107/67, pulse 74, temperature 97.7 F (36.5 C), temperature source Oral, height 5\' 5"  (1.651 m), weight 230 lb (104.3 kg), SpO2 99 %, not currently breastfeeding. Body mass index is 38.27 kg/m.  General: Cooperative, alert, well developed, in no acute distress. HEENT: Conjunctivae and lids unremarkable. Cardiovascular: Regular rhythm.  Lungs: Normal work of breathing. Neurologic: No focal deficits.   Lab Results  Component Value Date   CREATININE 0.71 05/23/2020   BUN 9 05/23/2020   NA 138 05/23/2020   K 4.5 05/23/2020   CL 100 05/23/2020   CO2 25 05/23/2020   Lab Results  Component Value Date   ALT 9 05/23/2020   AST 13 05/23/2020   ALKPHOS 107 05/23/2020   BILITOT 0.4 05/23/2020   Lab Results  Component Value Date   HGBA1C 5.4 05/23/2020   Lab Results  Component Value Date   INSULIN 13.2 05/23/2020   Lab Results  Component Value Date   TSH 1.640 05/23/2020   Lab Results  Component Value Date   CHOL 184 05/23/2020   HDL 64 05/23/2020   LDLCALC 104 (H) 05/23/2020   TRIG 91 05/23/2020   CHOLHDL  2.9 05/23/2020   Lab Results  Component Value Date   WBC 5.8 05/23/2020   HGB 11.7 05/23/2020   HCT 37.6 05/23/2020   MCV 79 05/23/2020   PLT 396 05/23/2020   Lab Results  Component Value Date   IRON 44 05/23/2020   TIBC 431 05/23/2020   FERRITIN 7 (L) 05/23/2020   Attestation Statements:   Reviewed by clinician on day of visit: allergies, medications, problem list, medical history, surgical history, family history, social history, and previous encounter notes.  Time spent on  visit including pre-visit chart review and post-visit care and charting was 31 minutes.    Trude Mcburney, am acting as Energy manager for [place], FNP-C.  I have reviewed the above documentation for accuracy and completeness, and I agree with the above. -  Jesse Sans, FNP

## 2020-07-19 DIAGNOSIS — Z20828 Contact with and (suspected) exposure to other viral communicable diseases: Secondary | ICD-10-CM | POA: Diagnosis not present

## 2020-07-24 ENCOUNTER — Ambulatory Visit (INDEPENDENT_AMBULATORY_CARE_PROVIDER_SITE_OTHER): Payer: BC Managed Care – PPO | Admitting: Family Medicine

## 2020-07-31 ENCOUNTER — Other Ambulatory Visit (INDEPENDENT_AMBULATORY_CARE_PROVIDER_SITE_OTHER): Payer: Self-pay | Admitting: Family Medicine

## 2020-07-31 DIAGNOSIS — F3289 Other specified depressive episodes: Secondary | ICD-10-CM

## 2020-07-31 DIAGNOSIS — E559 Vitamin D deficiency, unspecified: Secondary | ICD-10-CM

## 2020-07-31 MED ORDER — BUPROPION HCL ER (SR) 200 MG PO TB12: 200.0000 mg | ORAL_TABLET | Freq: Every morning | ORAL | 0 refills | Status: DC

## 2020-07-31 MED ORDER — BUPROPION HCL ER (SR) 150 MG PO TB12: 150.0000 mg | ORAL_TABLET | Freq: Every morning | ORAL | 0 refills | Status: DC

## 2020-07-31 MED ORDER — VITAMIN D (ERGOCALCIFEROL) 1.25 MG (50000 UNIT) PO CAPS: 50000.0000 [IU] | ORAL_CAPSULE | ORAL | 0 refills | Status: DC

## 2020-07-31 NOTE — Telephone Encounter (Signed)
Please advise regarding pt comment about dose increase.

## 2020-08-13 ENCOUNTER — Ambulatory Visit (INDEPENDENT_AMBULATORY_CARE_PROVIDER_SITE_OTHER): Payer: BC Managed Care – PPO | Admitting: Family Medicine

## 2020-08-13 ENCOUNTER — Other Ambulatory Visit: Payer: Self-pay

## 2020-08-13 ENCOUNTER — Encounter (INDEPENDENT_AMBULATORY_CARE_PROVIDER_SITE_OTHER): Payer: Self-pay | Admitting: Family Medicine

## 2020-08-13 VITALS — BP 110/71 | HR 65 | Temp 98.0°F | Ht 65.0 in | Wt 226.0 lb

## 2020-08-13 DIAGNOSIS — Z6837 Body mass index (BMI) 37.0-37.9, adult: Secondary | ICD-10-CM | POA: Diagnosis not present

## 2020-08-13 DIAGNOSIS — Z9189 Other specified personal risk factors, not elsewhere classified: Secondary | ICD-10-CM

## 2020-08-13 DIAGNOSIS — F3289 Other specified depressive episodes: Secondary | ICD-10-CM

## 2020-08-13 DIAGNOSIS — E559 Vitamin D deficiency, unspecified: Secondary | ICD-10-CM

## 2020-08-13 MED ORDER — VITAMIN D (ERGOCALCIFEROL) 1.25 MG (50000 UNIT) PO CAPS: 50000.0000 [IU] | ORAL_CAPSULE | ORAL | 0 refills | Status: DC

## 2020-08-14 ENCOUNTER — Encounter (INDEPENDENT_AMBULATORY_CARE_PROVIDER_SITE_OTHER): Payer: Self-pay | Admitting: Family Medicine

## 2020-08-14 DIAGNOSIS — F32A Depression, unspecified: Secondary | ICD-10-CM | POA: Insufficient documentation

## 2020-08-14 NOTE — Progress Notes (Signed)
Chief Complaint:   OBESITY Donna Marquez is here to discuss her progress with her obesity treatment plan along with follow-up of her obesity related diagnoses. Donna Marquez is on the Category 3 Plan with breakfast options and states she is following her eating plan approximately 40% of the time. Donna Marquez states she is walking, and doing strengthening and cardio for 45 minutes 3+ times per week.  Today's visit was #: 5 Starting weight: 240 lbs Starting date: 05/23/2020 Today's weight: 226 lbs Today's date: 08/13/2020 Total lbs lost to date: 14 Total lbs lost since last in-office visit: 4  Interim History: Christyne has been off the plan due to having a sick child. She is not getting enough protein in and she has missed some meals. She does well with exercising consistently. She and her husband prioritize time to exercise.  Subjective:   1. Vitamin D deficiency Donna Marquez's Vit D level is low at 18.5. She is on prescription Vit D.  2. Other depression with emotional eating Donna Marquez notes increased focus and less cravings with increased dose of bupropion. She denies insomnia or dry mouth.  3. At risk for side effect of medication Donna Marquez is at risk for drug side effects due to recent increased dose of Wellbutrin.  Assessment/Plan:   1. Vitamin D deficiency Low Vitamin D level contributes to fatigue and are associated with obesity, breast, and colon cancer. We will refill prescription Vitamin D for 1 month. Donna Marquez will follow-up for routine testing of Vitamin D, at least 2-3 times per year to avoid over-replacement.  - Vitamin D, Ergocalciferol, (DRISDOL) 1.25 MG (50000 UNIT) CAPS capsule; Take 1 capsule (50,000 Units total) by mouth every 7 (seven) days.  Dispense: 4 capsule; Refill: 0  2. Other depression with emotional eating Behavior modification techniques were discussed today to help Donna Marquez deal with her emotional/non-hunger eating behaviors. Donna Marquez will continue bupropion at 200 mg q daily.   3.  At risk for side effect of medication Donna Marquez was given approximately 15 minutes of drug side effect counseling today.  We discussed side effect possibility and risk versus benefits. Donna Marquez agreed to the medication and will contact this office if these side effects are intolerable.  Repetitive spaced learning was employed today to elicit superior memory formation and behavioral change.  4. Class 2 severe obesity with serious comorbidity and body mass index (BMI) of 37.0 to 37.9 in adult, unspecified obesity type (HCC) Donna Marquez is currently in the action stage of change. As such, her goal is to continue with weight loss efforts. She has agreed to the Category 3 Plan and keeping a food journal and adhering to recommended goals of 450-600 calories and 40 grams of protein at supper daily.   Exercise goals: As is.  Behavioral modification strategies: increasing lean protein intake and meal planning and cooking strategies.  Donna Marquez has agreed to follow-up with our clinic in 2 weeks.   Objective:   Blood pressure 110/71, pulse 65, temperature 98 F (36.7 C), temperature source Oral, height 5\' 5"  (1.651 m), weight 226 lb (102.5 kg), SpO2 98 %, not currently breastfeeding. Body mass index is 37.61 kg/m.  General: Cooperative, alert, well developed, in no acute distress. HEENT: Conjunctivae and lids unremarkable. Cardiovascular: Regular rhythm.  Lungs: Normal work of breathing. Neurologic: No focal deficits.   Lab Results  Component Value Date   CREATININE 0.71 05/23/2020   BUN 9 05/23/2020   NA 138 05/23/2020   K 4.5 05/23/2020   CL 100 05/23/2020   CO2  25 05/23/2020   Lab Results  Component Value Date   ALT 9 05/23/2020   AST 13 05/23/2020   ALKPHOS 107 05/23/2020   BILITOT 0.4 05/23/2020   Lab Results  Component Value Date   HGBA1C 5.4 05/23/2020   Lab Results  Component Value Date   INSULIN 13.2 05/23/2020   Lab Results  Component Value Date   TSH 1.640 05/23/2020   Lab  Results  Component Value Date   CHOL 184 05/23/2020   HDL 64 05/23/2020   LDLCALC 104 (H) 05/23/2020   TRIG 91 05/23/2020   CHOLHDL 2.9 05/23/2020   Lab Results  Component Value Date   WBC 5.8 05/23/2020   HGB 11.7 05/23/2020   HCT 37.6 05/23/2020   MCV 79 05/23/2020   PLT 396 05/23/2020   Lab Results  Component Value Date   IRON 44 05/23/2020   TIBC 431 05/23/2020   FERRITIN 7 (L) 05/23/2020   Attestation Statements:   Reviewed by clinician on day of visit: allergies, medications, problem list, medical history, surgical history, family history, social history, and previous encounter notes.   Trude Mcburney, am acting as Energy manager for [place], FNP-C.  I have reviewed the above documentation for accuracy and completeness, and I agree with the above. -  Jesse Sans, FNP

## 2020-08-24 ENCOUNTER — Encounter (INDEPENDENT_AMBULATORY_CARE_PROVIDER_SITE_OTHER): Payer: Self-pay | Admitting: Family Medicine

## 2020-08-28 ENCOUNTER — Telehealth (INDEPENDENT_AMBULATORY_CARE_PROVIDER_SITE_OTHER): Payer: BC Managed Care – PPO | Admitting: Family Medicine

## 2020-09-02 ENCOUNTER — Other Ambulatory Visit (INDEPENDENT_AMBULATORY_CARE_PROVIDER_SITE_OTHER): Payer: Self-pay | Admitting: Family Medicine

## 2020-09-02 DIAGNOSIS — E559 Vitamin D deficiency, unspecified: Secondary | ICD-10-CM

## 2020-09-02 DIAGNOSIS — F3289 Other specified depressive episodes: Secondary | ICD-10-CM

## 2020-09-04 ENCOUNTER — Other Ambulatory Visit: Payer: Self-pay

## 2020-09-04 ENCOUNTER — Encounter (INDEPENDENT_AMBULATORY_CARE_PROVIDER_SITE_OTHER): Payer: Self-pay | Admitting: Family Medicine

## 2020-09-04 ENCOUNTER — Ambulatory Visit (INDEPENDENT_AMBULATORY_CARE_PROVIDER_SITE_OTHER): Payer: BC Managed Care – PPO | Admitting: Family Medicine

## 2020-09-04 VITALS — BP 111/72 | HR 83 | Temp 97.9°F | Ht 65.0 in | Wt 223.0 lb

## 2020-09-04 DIAGNOSIS — E559 Vitamin D deficiency, unspecified: Secondary | ICD-10-CM | POA: Diagnosis not present

## 2020-09-04 DIAGNOSIS — F3289 Other specified depressive episodes: Secondary | ICD-10-CM

## 2020-09-04 DIAGNOSIS — Z9189 Other specified personal risk factors, not elsewhere classified: Secondary | ICD-10-CM | POA: Diagnosis not present

## 2020-09-04 DIAGNOSIS — Z6837 Body mass index (BMI) 37.0-37.9, adult: Secondary | ICD-10-CM

## 2020-09-04 DIAGNOSIS — E66812 Obesity, class 2: Secondary | ICD-10-CM

## 2020-09-04 MED ORDER — BUPROPION HCL ER (SR) 150 MG PO TB12: 150.0000 mg | ORAL_TABLET | Freq: Two times a day (BID) | ORAL | 0 refills | Status: DC

## 2020-09-04 MED ORDER — VITAMIN D (ERGOCALCIFEROL) 1.25 MG (50000 UNIT) PO CAPS: 50000.0000 [IU] | ORAL_CAPSULE | ORAL | 0 refills | Status: DC

## 2020-09-04 MED ORDER — BUPROPION HCL ER (SR) 200 MG PO TB12: 200.0000 mg | ORAL_TABLET | Freq: Every morning | ORAL | 0 refills | Status: DC

## 2020-09-04 MED ORDER — WEGOVY 0.25 MG/0.5ML ~~LOC~~ SOAJ: 0.2500 mg | SUBCUTANEOUS | 0 refills | Status: DC

## 2020-09-05 ENCOUNTER — Encounter (INDEPENDENT_AMBULATORY_CARE_PROVIDER_SITE_OTHER): Payer: Self-pay | Admitting: Family Medicine

## 2020-09-05 ENCOUNTER — Encounter (INDEPENDENT_AMBULATORY_CARE_PROVIDER_SITE_OTHER): Payer: Self-pay

## 2020-09-05 NOTE — Telephone Encounter (Signed)
[email or web address]

## 2020-09-05 NOTE — Progress Notes (Signed)
Chief Complaint:   OBESITY Donna Marquez is here to discuss her progress with her obesity treatment plan along with follow-up of her obesity related diagnoses. Donna Marquez is on the Category 3 Plan and keeping a food journal and adhering to recommended goals of 450-600 calories and 40 grams of protein at supper daily and states she is following her eating plan approximately 60% of the time. Donna Marquez states she is walking for 45 minutes 5 times per week.  Today's visit was #: 6 Starting weight: 240 lbs Starting date: 05/23/2020 Today's weight: 223 lbs Today's date: 09/04/2020 Total lbs lost to date: 17 Total lbs lost since last in-office visit: 3  Interim History: Donna Marquez has been sick but adhered to the plan the best she could. She has lost 3 lbs since last OV. She has done extremely well overall, and she has lost 17 lbs since June.  Subjective:   1. Vitamin D deficiency Donna Marquez's last Vit D level was low at 18.5. She is on weekly prescription Vit D.  2. Other depression with emotional eating Donna Marquez feels the bupropion wears off later in the day and her cravings worsen in the evening.  3. At risk for side effect of medication Donna Marquez is at risk for drug side effects due to start of Wegovy.  Assessment/Plan:   1. Vitamin D deficiency Low Vitamin D level contributes to fatigue and are associated with obesity, breast, and colon cancer. We will refill prescription Vitamin D for 1 month. Donna Marquez will follow-up for routine testing of Vitamin D, at least 2-3 times per year to avoid over-replacement.  - Vitamin D, Ergocalciferol, (DRISDOL) 1.25 MG (50000 UNIT) CAPS capsule; Take 1 capsule (50,000 Units total) by mouth every 7 (seven) days.  Dispense: 4 capsule; Refill: 0  2. Other depression with emotional eating  We will refill bupropion for 1 month.  - buPROPion (WELLBUTRIN SR) 150 MG 12 hr tablet; Take 1 tablet (150 mg total) by mouth 2 (two) times daily.  Dispense: 60 tablet; Refill: 0  3. At  risk for side effect of medication Donna Marquez was given approximately 15 minutes of drug side effect counseling today.  We discussed side effect possibility and risk versus benefits. Donna Marquez agreed to the medication and will contact this office if these side effects are intolerable.  Repetitive spaced learning was employed today to elicit superior memory formation and behavioral change.  4. Class 2 severe obesity with serious comorbidity and body mass index (BMI) of 37.0 to 37.9 in adult, unspecified obesity type (HCC) Donna Marquez is currently in the action stage of change. As such, her goal is to continue with weight loss efforts. She has agreed to the Category 3 Plan.   We will recheck fasting labs at her next office visit.  We discussed various medication options to help Donna Marquez with her weight loss efforts and we both agreed to start Wegovy 0.25 mg SubQ weekly with no refills. Donna Marquez denies personal or family history of thyroid cancer, history of pancreatitis, or current cholelithiasis.  - Semaglutide-Weight Management (WEGOVY) 0.25 MG/0.5ML SOAJ; Inject 0.25 mg into the skin once a week.  Dispense: 2 mL; Refill: 0  Exercise goals: As is.  Behavioral modification strategies: increasing lean protein intake and decreasing simple carbohydrates.  Donna Marquez has agreed to follow-up with our clinic in 3 weeks. She was informed of the importance of frequent follow-up visits to maximize her success with intensive lifestyle modifications for her multiple health conditions.   Objective:   Blood pressure 111/72,  pulse 83, temperature 97.9 F (36.6 C), height 5\' 5"  (1.651 m), weight 223 lb (101.2 kg), SpO2 100 %, not currently breastfeeding. Body mass index is 37.11 kg/m.  General: Cooperative, alert, well developed, in no acute distress. HEENT: Conjunctivae and lids unremarkable. Cardiovascular: Regular rhythm.  Lungs: Normal work of breathing. Neurologic: No focal deficits.   Lab Results  Component  Value Date   CREATININE 0.71 05/23/2020   BUN 9 05/23/2020   NA 138 05/23/2020   K 4.5 05/23/2020   CL 100 05/23/2020   CO2 25 05/23/2020   Lab Results  Component Value Date   ALT 9 05/23/2020   AST 13 05/23/2020   ALKPHOS 107 05/23/2020   BILITOT 0.4 05/23/2020   Lab Results  Component Value Date   HGBA1C 5.4 05/23/2020   Lab Results  Component Value Date   INSULIN 13.2 05/23/2020   Lab Results  Component Value Date   TSH 1.640 05/23/2020   Lab Results  Component Value Date   CHOL 184 05/23/2020   HDL 64 05/23/2020   LDLCALC 104 (H) 05/23/2020   TRIG 91 05/23/2020   CHOLHDL 2.9 05/23/2020   Lab Results  Component Value Date   WBC 5.8 05/23/2020   HGB 11.7 05/23/2020   HCT 37.6 05/23/2020   MCV 79 05/23/2020   PLT 396 05/23/2020   Lab Results  Component Value Date   IRON 44 05/23/2020   TIBC 431 05/23/2020   FERRITIN 7 (L) 05/23/2020   Attestation Statements:   Reviewed by clinician on day of visit: allergies, medications, problem list, medical history, surgical history, family history, social history, and previous encounter notes.   05/25/2020, am acting as Trude Mcburney for Energy manager, FNP-C.  I have reviewed the above documentation for accuracy and completeness, and I agree with the above. -  [place], FNP

## 2020-09-20 DIAGNOSIS — J029 Acute pharyngitis, unspecified: Secondary | ICD-10-CM | POA: Diagnosis not present

## 2020-09-20 DIAGNOSIS — R051 Acute cough: Secondary | ICD-10-CM | POA: Diagnosis not present

## 2020-09-24 ENCOUNTER — Encounter (INDEPENDENT_AMBULATORY_CARE_PROVIDER_SITE_OTHER): Payer: Self-pay

## 2020-09-24 NOTE — Telephone Encounter (Signed)
Please advise 

## 2020-09-26 ENCOUNTER — Other Ambulatory Visit: Payer: Self-pay

## 2020-09-26 ENCOUNTER — Ambulatory Visit (INDEPENDENT_AMBULATORY_CARE_PROVIDER_SITE_OTHER): Payer: BC Managed Care – PPO | Admitting: Family Medicine

## 2020-09-26 ENCOUNTER — Encounter (INDEPENDENT_AMBULATORY_CARE_PROVIDER_SITE_OTHER): Payer: Self-pay | Admitting: Family Medicine

## 2020-09-26 VITALS — BP 101/65 | HR 75 | Temp 98.3°F | Ht 65.0 in | Wt 221.0 lb

## 2020-09-26 DIAGNOSIS — E559 Vitamin D deficiency, unspecified: Secondary | ICD-10-CM | POA: Diagnosis not present

## 2020-09-26 DIAGNOSIS — E88819 Insulin resistance, unspecified: Secondary | ICD-10-CM

## 2020-09-26 DIAGNOSIS — E8881 Metabolic syndrome: Secondary | ICD-10-CM | POA: Diagnosis not present

## 2020-09-26 DIAGNOSIS — E7849 Other hyperlipidemia: Secondary | ICD-10-CM | POA: Diagnosis not present

## 2020-09-26 DIAGNOSIS — D508 Other iron deficiency anemias: Secondary | ICD-10-CM | POA: Diagnosis not present

## 2020-09-26 DIAGNOSIS — F3289 Other specified depressive episodes: Secondary | ICD-10-CM

## 2020-09-26 DIAGNOSIS — Z9189 Other specified personal risk factors, not elsewhere classified: Secondary | ICD-10-CM

## 2020-09-26 DIAGNOSIS — Z6836 Body mass index (BMI) 36.0-36.9, adult: Secondary | ICD-10-CM

## 2020-09-27 ENCOUNTER — Encounter (INDEPENDENT_AMBULATORY_CARE_PROVIDER_SITE_OTHER): Payer: Self-pay | Admitting: Family Medicine

## 2020-09-27 DIAGNOSIS — E8881 Metabolic syndrome: Secondary | ICD-10-CM | POA: Insufficient documentation

## 2020-09-27 DIAGNOSIS — E88819 Insulin resistance, unspecified: Secondary | ICD-10-CM | POA: Insufficient documentation

## 2020-09-27 LAB — CBC WITH DIFFERENTIAL/PLATELET
Basophils Absolute: 0.1 10*3/uL (ref 0.0–0.2)
Basos: 1 %
EOS (ABSOLUTE): 0.1 10*3/uL (ref 0.0–0.4)
Eos: 2 %
Hematocrit: 36.2 % (ref 34.0–46.6)
Hemoglobin: 11.5 g/dL (ref 11.1–15.9)
Immature Grans (Abs): 0 10*3/uL (ref 0.0–0.1)
Immature Granulocytes: 0 %
Lymphocytes Absolute: 2.2 10*3/uL (ref 0.7–3.1)
Lymphs: 38 %
MCH: 25.1 pg — ABNORMAL LOW (ref 26.6–33.0)
MCHC: 31.8 g/dL (ref 31.5–35.7)
MCV: 79 fL (ref 79–97)
Monocytes Absolute: 0.4 10*3/uL (ref 0.1–0.9)
Monocytes: 7 %
Neutrophils Absolute: 3 10*3/uL (ref 1.4–7.0)
Neutrophils: 52 %
Platelets: 370 10*3/uL (ref 150–450)
RBC: 4.59 x10E6/uL (ref 3.77–5.28)
RDW: 15.2 % (ref 11.7–15.4)
WBC: 5.8 10*3/uL (ref 3.4–10.8)

## 2020-09-27 LAB — FOLATE: Folate: 13.1 ng/mL (ref >3.0)

## 2020-09-27 LAB — IRON AND TIBC
Iron Saturation: 9 % — CL (ref 15–55)
Iron: 35 ug/dL (ref 27–159)
Total Iron Binding Capacity: 388 ug/dL (ref 250–450)
UIBC: 353 ug/dL (ref 131–425)

## 2020-09-27 LAB — LIPID PANEL WITH LDL/HDL RATIO
Cholesterol, Total: 145 mg/dL (ref 100–199)
HDL: 49 mg/dL (ref >39)
LDL Chol Calc (NIH): 82 mg/dL (ref 0–99)
LDL/HDL Ratio: 1.7 ratio (ref 0.0–3.2)
Triglycerides: 71 mg/dL (ref 0–149)
VLDL Cholesterol Cal: 14 mg/dL (ref 5–40)

## 2020-09-27 LAB — VITAMIN B12: Vitamin B-12: 566 pg/mL (ref 232–1245)

## 2020-09-27 LAB — INSULIN, RANDOM: INSULIN: 16.6 u[IU]/mL (ref 2.6–24.9)

## 2020-09-27 LAB — FERRITIN: Ferritin: 8 ng/mL — ABNORMAL LOW (ref 15–150)

## 2020-09-27 LAB — HEMOGLOBIN A1C
Est. average glucose Bld gHb Est-mCnc: 114 mg/dL
Hgb A1c MFr Bld: 5.6 % (ref 4.8–5.6)

## 2020-09-27 LAB — VITAMIN D 25 HYDROXY (VIT D DEFICIENCY, FRACTURES): Vit D, 25-Hydroxy: 48 ng/mL (ref 30.0–100.0)

## 2020-09-27 NOTE — Progress Notes (Signed)
Chief Complaint:   OBESITY Donna Marquez is here to discuss her progress with her obesity treatment plan along with follow-up of her obesity related diagnoses. Donna Marquez is on the Category 3 Plan and states she is following her eating plan approximately 50% of the time. Donna Marquez states she is walking for 30 minutes 3-4 times per week.  Today's visit was #: 7 Starting weight: 240 lbs Starting date: 05/23/2020 Today's weight: 221 lbs Today's date: 09/26/2020 Total lbs lost to date: 19 Total lbs lost since last in-office visit: 2  Interim History: Donna Marquez notes carbohydrate cravings due to upcoming menses. She started [facility] yesterday. She has no side effects so far with [facility]. She has not done as well on the plan last week but is still down 2 lbs. She is still happy with the Category 3 plan overall.   Subjective:   1. Other iron deficiency anemia Donna Marquez has a history of anemia. She is on iron 325 mg daily. Last hemoglobin was low at 9.5 after the birth of her son last December, then increased up to 11.7 in June 2021.   CBC Latest Ref Rng & Units 09/26/2020 05/23/2020 11/02/2019  WBC 3.4 - 10.8 x10E3/uL 5.8 5.8 14.5(H)  Hemoglobin 11.1 - 15.9 g/dL 21.9 75.8 8.3(G)  Hematocrit 34.0 - 46.6 % 36.2 37.6 29.3(L)  Platelets 150 - 450 x10E3/uL 370 396 252   Lab Results  Component Value Date   IRON 35 09/26/2020   TIBC 388 09/26/2020   FERRITIN 8 (L) 09/26/2020   Lab Results  Component Value Date   VITAMINB12 566 09/26/2020   2. Other hyperlipidemia Donna Marquez's last LDL was 104, HDL 64, and triglycerides 91. She is not on statin.   Lab Results  Component Value Date   ALT 9 05/23/2020   AST 13 05/23/2020   ALKPHOS 107 05/23/2020   BILITOT 0.4 05/23/2020   Lab Results  Component Value Date   CHOL 145 09/26/2020   HDL 49 09/26/2020   LDLCALC 82 09/26/2020   TRIG 71 09/26/2020   CHOLHDL 2.9 05/23/2020   3. Insulin resistance [name] is on metformin but she wishes to stop this since we  started [facility]. Last A1c was 5.4.  She continues to work on diet and exercise to decrease her risk of diabetes.  Lab Results  Component Value Date   INSULIN 16.6 09/26/2020   INSULIN 13.2 05/23/2020   Lab Results  Component Value Date   HGBA1C 5.6 09/26/2020   4. Vitamin D deficiency Donna Marquez's last Vit D level was low at 18.5. She is on weekly prescription Vit D.  5. Other depression with emotional eating Donna Marquez feels her cravings are well controlled with bupropion.  6. At risk for side effect of medication Donna Marquez is at risk for drug side effects due to Donna Marquez (nausea, constipation).  Assessment/Plan:   1. Other iron deficiency anemia We will check labs today. Donna Marquez will continue iron 325 mg daily.   - CBC with Differential/Platelet - Vitamin B12 - Folate - Iron and TIBC - Ferritin  2. Other hyperlipidemia Cardiovascular risk and specific lipid/LDL goals reviewed. We discussed several lifestyle modifications today and Donna Marquez will continue to work on diet, exercise and weight loss efforts. We will check labs today.  - Lipid Panel With LDL/HDL Ratio  3. Insulin resistance Donna Marquez will continue to work on weight loss, exercise, and decreasing simple carbohydrates to help decrease the risk of diabetes. We will check labs today. Donna Marquez will discontinue metformin and will continue  Wegovy. Donna Marquez agreed to follow-up with [place] as directed to closely monitor her progress.  - Hemoglobin A1c - Insulin, random  4. Vitamin D deficiency  We will check labs today. Donna Marquez agreed to continue taking prescription Vitamin D 50,000 IU every week and will follow-up for routine testing of Vitamin D, at least 2-3 times per year to avoid over-replacement.  - VITAMIN D 25 Hydroxy (Vit-D Deficiency, Fractures)  5. Other depression with emotional eating . Donna Marquez will continue bupropion BID.    6. At risk for side effect of medication Donna Marquez was given approximately 15 minutes of drug side effect  counseling today.  We discussed side effect possibility and risk versus benefits. Donna Marquez agreed to the medication and will contact this office if these side effects are intolerable.  Repetitive spaced learning was employed today to elicit superior memory formation and behavioral change.  7. Class 2 severe obesity with serious comorbidity and body mass index (BMI) of 36.0 to 36.9 in adult, unspecified obesity type (HCC) Donna Marquez is currently in the action stage of change. As such, her goal is to continue with weight loss efforts. She has agreed to the Category 3 Plan.   Exercise goals: As is.  Behavioral modification strategies: increasing lean protein intake and decreasing simple carbohydrates.  Donna Marquez has agreed to follow-up with our clinic in 3 weeks.  Donna Marquez was informed we would discuss her lab results at her next visit unless there is a critical issue that needs to be addressed sooner. Donna Marquez agreed to keep her next visit at the agreed upon time to discuss these results.  Objective:   Blood pressure 101/65, pulse 75, temperature 98.3 F (36.8 C), height 5\' 5"  (1.651 m), weight 221 lb (100.2 kg), SpO2 99 %, not currently breastfeeding. Body mass index is 36.78 kg/m.  General: Cooperative, alert, well developed, in no acute distress. HEENT: Conjunctivae and lids unremarkable. No thyromegaly. Cardiovascular: Regular rhythm.  Lungs: Normal work of breathing. Neurologic: No focal deficits.   Lab Results  Component Value Date   CREATININE 0.71 05/23/2020   BUN 9 05/23/2020   NA 138 05/23/2020   K 4.5 05/23/2020   CL 100 05/23/2020   CO2 25 05/23/2020   Lab Results  Component Value Date   ALT 9 05/23/2020   AST 13 05/23/2020   ALKPHOS 107 05/23/2020   BILITOT 0.4 05/23/2020   Lab Results  Component Value Date   HGBA1C 5.6 09/26/2020   HGBA1C 5.4 05/23/2020   Lab Results  Component Value Date   INSULIN 16.6 09/26/2020   INSULIN 13.2 05/23/2020   Lab Results  Component  Value Date   TSH 1.640 05/23/2020   Lab Results  Component Value Date   CHOL 145 09/26/2020   HDL 49 09/26/2020   LDLCALC 82 09/26/2020   TRIG 71 09/26/2020   CHOLHDL 2.9 05/23/2020   Lab Results  Component Value Date   WBC 5.8 09/26/2020   HGB 11.5 09/26/2020   HCT 36.2 09/26/2020   MCV 79 09/26/2020   PLT 370 09/26/2020   Lab Results  Component Value Date   IRON 35 09/26/2020   TIBC 388 09/26/2020   FERRITIN 8 (L) 09/26/2020   Attestation Statements:   Reviewed by clinician on day of visit: allergies, medications, problem list, medical history, surgical history, family history, social history, and previous encounter notes.   09/28/2020, am acting as Trude Mcburney for Energy manager, FNP-C.  I have reviewed the above documentation for accuracy and completeness, and I  agree with the above. -  Jesse Sans, FNP

## 2020-10-17 ENCOUNTER — Encounter (INDEPENDENT_AMBULATORY_CARE_PROVIDER_SITE_OTHER): Payer: Self-pay | Admitting: Family Medicine

## 2020-10-17 ENCOUNTER — Other Ambulatory Visit: Payer: Self-pay

## 2020-10-17 ENCOUNTER — Telehealth (INDEPENDENT_AMBULATORY_CARE_PROVIDER_SITE_OTHER): Payer: BC Managed Care – PPO | Admitting: Family Medicine

## 2020-10-17 VITALS — Ht 65.0 in | Wt 217.0 lb

## 2020-10-17 DIAGNOSIS — E559 Vitamin D deficiency, unspecified: Secondary | ICD-10-CM | POA: Diagnosis not present

## 2020-10-17 DIAGNOSIS — D508 Other iron deficiency anemias: Secondary | ICD-10-CM

## 2020-10-17 DIAGNOSIS — Z6836 Body mass index (BMI) 36.0-36.9, adult: Secondary | ICD-10-CM

## 2020-10-17 DIAGNOSIS — E7849 Other hyperlipidemia: Secondary | ICD-10-CM | POA: Diagnosis not present

## 2020-10-17 MED ORDER — VITAMIN D (ERGOCALCIFEROL) 1.25 MG (50000 UNIT) PO CAPS: 50000.0000 [IU] | ORAL_CAPSULE | ORAL | 0 refills | Status: DC

## 2020-10-17 MED ORDER — WEGOVY 0.25 MG/0.5ML ~~LOC~~ SOAJ: 0.2500 mg | SUBCUTANEOUS | 0 refills | Status: DC

## 2020-10-19 ENCOUNTER — Other Ambulatory Visit (INDEPENDENT_AMBULATORY_CARE_PROVIDER_SITE_OTHER): Payer: Self-pay | Admitting: Family Medicine

## 2020-10-22 ENCOUNTER — Encounter (INDEPENDENT_AMBULATORY_CARE_PROVIDER_SITE_OTHER): Payer: Self-pay

## 2020-10-22 ENCOUNTER — Other Ambulatory Visit (INDEPENDENT_AMBULATORY_CARE_PROVIDER_SITE_OTHER): Payer: Self-pay

## 2020-10-22 ENCOUNTER — Encounter (INDEPENDENT_AMBULATORY_CARE_PROVIDER_SITE_OTHER): Payer: Self-pay | Admitting: Family Medicine

## 2020-10-22 NOTE — Progress Notes (Signed)
TeleHealth Visit:  Due to the COVID-19 pandemic, this visit was completed with telemedicine (audio/video) technology to reduce patient and provider exposure as well as to preserve personal protective equipment.   Donna Marquez has verbally consented to this TeleHealth visit. The patient is located at home, the provider is located at the [facility] office. The participants in this visit include the listed provider and patient. The visit was conducted today via MyChart video.   Chief Complaint: OBESITY Chevy is here to discuss her progress with her obesity treatment plan along with follow-up of her obesity related diagnoses. Donna Marquez is on the Category 3 Plan and states she is following her eating plan approximately 70% of the time. Makayle states she is doing cardio and weights for 45 minutes 3 times per week.  Today's visit was #: 8 Starting weight: 240 lbs Starting date: 05/23/2020  Interim History: Donna Marquez is down a few lbs since her last office visit. We started her on Wegovy at her last office visit. She notes mild GI upset for one day after injection. She feels she is less hungry and she has less cravings. She sometimes forgets to eat all of her food.  Subjective:   1. Other iron deficiency anemia Justyn's hemoglobin is low normal, and her iron sat is low. She has been anemic for years. Her water intake is good, and her vegetable intake is good. Her hemoglobin was 9.5 last year after the birth of her baby. She is unsure if her multivitamins has iron. She is on iron 325 mg q daily.  CBC Latest Ref Rng & Units 09/26/2020 05/23/2020 11/02/2019  WBC 3.4 - 10.8 x10E3/uL 5.8 5.8 14.5(H)  Hemoglobin 11.1 - 15.9 g/dL 93.7 16.9 6.7(E)  Hematocrit 34.0 - 46.6 % 36.2 37.6 29.3(L)  Platelets 150 - 450 x10E3/uL 370 396 252   Lab Results  Component Value Date   IRON 35 09/26/2020   TIBC 388 09/26/2020   FERRITIN 8 (L) 09/26/2020   Lab Results  Component Value Date   VITAMINB12 566  09/26/2020   2. Vitamin D deficiency Danai's Vit D level is slightly low at 48. She is on weekly prescription Vit D.  3. Other hyperlipidemia Shadana's LDL is down to 82 from 104, and HDL is slightly low at 49. She is not on statin.   Lab Results  Component Value Date   ALT 9 05/23/2020   AST 13 05/23/2020   ALKPHOS 107 05/23/2020   BILITOT 0.4 05/23/2020   Lab Results  Component Value Date   CHOL 145 09/26/2020   HDL 49 09/26/2020   LDLCALC 82 09/26/2020   TRIG 71 09/26/2020   CHOLHDL 2.9 05/23/2020   Assessment/Plan:   1. Other iron deficiency anemia Donna Marquez agreed to increase iron to BID (325 mg), and will monitor for constipation. She may use miralax or metamucil as needed.   2. Vitamin D deficiency  We will refill prescription Vitamin D for 1 month. Donna Marquez will follow-up for routine testing of Vitamin D, at least 2-3 times per year to avoid over-replacement.  - Vitamin D, Ergocalciferol, (DRISDOL) 1.25 MG (50000 UNIT) CAPS capsule; Take 1 capsule (50,000 Units total) by mouth every 7 (seven) days.  Dispense: 4 capsule; Refill: 0  3. Other hyperlipidemia Cardiovascular risk and specific lipid/LDL goals reviewed. We discussed several lifestyle modifications today. Dove will continue her meal plan, and will continue to work on exercise and weight loss efforts.  4. Class 2 severe obesity with serious comorbidity  and body mass index (BMI) of 36.0 to 36.9 in adult, unspecified obesity type (HCC) Donna Marquez is currently in the action stage of change. As such, her goal is to continue with weight loss efforts. She has agreed to the Category 3 Plan.   We discussed various medication options to help Donna Marquez with her weight loss efforts and we both agreed to continue [facility] and we will refill for 1 month.  - Semaglutide-Weight Management (WEGOVY) 0.25 MG/0.5ML SOAJ; Inject 0.25 mg into the skin once a week.  Dispense: 2 mL; Refill: 0  Exercise goals: As is.  Behavioral modification  strategies: decreasing simple carbohydrates and holiday eating strategies .  Donna Marquez has agreed to follow-up with our clinic in 3 weeks.   Objective:   VITALS: Per patient if applicable, see vitals. GENERAL: Alert and in no acute distress. CARDIOPULMONARY: No increased WOB. Speaking in clear sentences.  PSYCH: Pleasant and cooperative. Speech normal rate and rhythm. Affect is appropriate. Insight and judgement are appropriate. Attention is focused, linear, and appropriate.  NEURO: Oriented as arrived to appointment on time with no prompting.   Lab Results  Component Value Date   CREATININE 0.71 05/23/2020   BUN 9 05/23/2020   NA 138 05/23/2020   K 4.5 05/23/2020   CL 100 05/23/2020   CO2 25 05/23/2020   Lab Results  Component Value Date   ALT 9 05/23/2020   AST 13 05/23/2020   ALKPHOS 107 05/23/2020   BILITOT 0.4 05/23/2020   Lab Results  Component Value Date   HGBA1C 5.6 09/26/2020   HGBA1C 5.4 05/23/2020   Lab Results  Component Value Date   INSULIN 16.6 09/26/2020   INSULIN 13.2 05/23/2020   Lab Results  Component Value Date   TSH 1.640 05/23/2020   Lab Results  Component Value Date   CHOL 145 09/26/2020   HDL 49 09/26/2020   LDLCALC 82 09/26/2020   TRIG 71 09/26/2020   CHOLHDL 2.9 05/23/2020   Lab Results  Component Value Date   WBC 5.8 09/26/2020   HGB 11.5 09/26/2020   HCT 36.2 09/26/2020   MCV 79 09/26/2020   PLT 370 09/26/2020   Lab Results  Component Value Date   IRON 35 09/26/2020   TIBC 388 09/26/2020   FERRITIN 8 (L) 09/26/2020    Attestation Statements:   Reviewed by clinician on day of visit: allergies, medications, problem list, medical history, surgical history, family history, social history, and previous encounter notes.   Trude Mcburney, am acting as Energy manager for [place], FNP-C.  I have reviewed the above documentation for accuracy and completeness, and I agree with the above. - Jesse Sans, FNP

## 2020-10-22 NOTE — Telephone Encounter (Signed)
This patient was last seen by Dawn Whitmire, FNP, and currently has an upcoming appt scheduled on 11/07/20 with her.  

## 2020-10-22 NOTE — Telephone Encounter (Signed)
Mychart message sent to patient advising I just spoke to the pharmacy and they have her prescription. Instructed pt to give them a call and have them fill it. If they have no Wegovy in stock pt advised that she can check around at other pharmacies to see if they have any. Once pt  finds a pharmacy that has it in stock instructed pt to  ask the pharmacy staff to call [facility] in [place] to transfer the prescription.  Tresean Mattix LPN

## 2020-10-22 NOTE — Telephone Encounter (Signed)
Hi Harlowe, I just spoke to the pharmacy they have your prescription. Please give them a call and have them fill it. If they have no Wegovy in stock you can check around at other pharmacies to see if they have any. Once you find a pharmacy that has it in stock please ask the pharmacy staff to call [facility] in [place] to transfer the prescription.  Have a great day. Leticia Coletta LPN

## 2020-10-22 NOTE — Telephone Encounter (Signed)
Pt called stating that her pharmacy [phone] [place],[id number])  hasn't received her Reginal Lutes, please give pt a call.

## 2020-11-07 ENCOUNTER — Ambulatory Visit (INDEPENDENT_AMBULATORY_CARE_PROVIDER_SITE_OTHER): Payer: BC Managed Care – PPO | Admitting: Family Medicine

## 2020-11-07 ENCOUNTER — Other Ambulatory Visit: Payer: Self-pay

## 2020-11-07 ENCOUNTER — Encounter (INDEPENDENT_AMBULATORY_CARE_PROVIDER_SITE_OTHER): Payer: Self-pay | Admitting: Family Medicine

## 2020-11-07 VITALS — BP 113/70 | HR 78 | Temp 97.5°F | Ht 65.0 in | Wt 215.0 lb

## 2020-11-07 DIAGNOSIS — Z6835 Body mass index (BMI) 35.0-35.9, adult: Secondary | ICD-10-CM

## 2020-11-07 DIAGNOSIS — E559 Vitamin D deficiency, unspecified: Secondary | ICD-10-CM

## 2020-11-07 DIAGNOSIS — F3289 Other specified depressive episodes: Secondary | ICD-10-CM | POA: Diagnosis not present

## 2020-11-07 DIAGNOSIS — Z9189 Other specified personal risk factors, not elsewhere classified: Secondary | ICD-10-CM | POA: Diagnosis not present

## 2020-11-07 MED ORDER — VITAMIN D (ERGOCALCIFEROL) 1.25 MG (50000 UNIT) PO CAPS: 50000.0000 [IU] | ORAL_CAPSULE | ORAL | 0 refills | Status: DC

## 2020-11-07 MED ORDER — BUPROPION HCL ER (SR) 150 MG PO TB12: 150.0000 mg | ORAL_TABLET | Freq: Two times a day (BID) | ORAL | 0 refills | Status: DC

## 2020-11-07 MED ORDER — WEGOVY 0.5 MG/0.5ML ~~LOC~~ SOAJ: 0.5000 mg | SUBCUTANEOUS | 0 refills | Status: DC

## 2020-11-08 NOTE — Progress Notes (Signed)
Chief Complaint:   OBESITY Donna Marquez is here to discuss her progress with her obesity treatment plan along with follow-up of her obesity related diagnoses. [name] is on the Category 3 Plan and states she is following her eating plan approximately 30-40% of the time. [name] states she is doing 0 minutes 0 times per week.  Today's visit was #: 9 Starting weight: 240 lbs Starting date: 05/23/2020 Today's weight: 215 lbs Today's date: 11/07/2020 Total lbs lost to date: 25 Total lbs lost since last in-office visit: 6  Interim History: [name] notes being quite busy at work and personally. She notes meal planning and exercise have been less than usual. She is still managing to cook at home. However she has lost 6 lbs today. She has a lot of special occasions coming up. . She is on Wegovy 0.25 mg weekly.  Subjective:   1. Vitamin D deficiency Imara's Vit D level is slightly low at 48. She is on weekly prescription Vit D.  2. Other depression with emotional eating [name]'s mood is stable, and her focus is improved with bupropion BID 150 mg. Her cravings are well controlled.  3. At risk for side effect of medication [name] is at risk for drug side effects due to increased dose of Wegovy.  Assessment/Plan:   1. Vitamin D deficiency  We will refill prescription Vitamin D for 1 month. [name] will follow-up for routine testing of Vitamin D, at least 2-3 times per year to avoid over-replacement.  - Vitamin D, Ergocalciferol, (DRISDOL) 1.25 MG (50000 UNIT) CAPS capsule; Take 1 capsule (50,000 Units total) by mouth every 7 (seven) days.  Dispense: 4 capsule; Refill: 0  2. Other depression with emotional eating  We will refill bupropion for 1 month. Orders and follow up as documented in patient record.   - buPROPion (WELLBUTRIN SR) 150 MG 12 hr tablet; Take 1 tablet (150 mg total) by mouth 2 (two) times daily.  Dispense: 60 tablet; Refill: 0  3. At risk for side effect of medication [name]  was given approximately 15 minutes of drug side effect counseling today. We discussed side effect possibility and risk versus benefits. [name] agreed to the medication and will contact this office if these side effects are intolerable.  Repetitive spaced learning was employed today to elicit superior memory formation and behavioral change.  4. Class 2 severe obesity with serious comorbidity and body mass index (BMI) of 35.0 to 35.9 in adult, unspecified obesity type (HCC) Nevelyn is currently in the action stage of change. As such, her goal is to continue with weight loss efforts. She has agreed to the Category 3 Plan.   We discussed various medication options to help [name] with her weight loss efforts and we both agreed to increase Wegovy to 0.50 mg weekly with no refills.  - Semaglutide-Weight Management (WEGOVY) 0.5 MG/0.5ML SOAJ; Inject 0.5 mg into the skin once a week.  Dispense: 2 mL; Refill: 0  Exercise goals: [name] will try to get back to her usual exercise regimen.  Behavioral modification strategies: increasing lean protein intake and decreasing simple carbohydrates.  [name] has agreed to follow-up with our clinic in 4 weeks. Objective:   Blood pressure 113/70, pulse 78, temperature (!) 97.5 F (36.4 C), height 5\' 5"  (1.651 m), weight 215 lb (97.5 kg), SpO2 99 %, not currently breastfeeding. Body mass index is 35.78 kg/m.  General: Cooperative, alert, well developed, in no acute distress. HEENT: Conjunctivae and lids unremarkable. Cardiovascular: Regular rhythm.  Lungs:  Normal work of breathing. Neurologic: No focal deficits.   Lab Results  Component Value Date   CREATININE 0.71 05/23/2020   BUN 9 05/23/2020   NA 138 05/23/2020   K 4.5 05/23/2020   CL 100 05/23/2020   CO2 25 05/23/2020   Lab Results  Component Value Date   ALT 9 05/23/2020   AST 13 05/23/2020   ALKPHOS 107 05/23/2020   BILITOT 0.4 05/23/2020   Lab Results  Component Value Date   HGBA1C 5.6  09/26/2020   HGBA1C 5.4 05/23/2020   Lab Results  Component Value Date   INSULIN 16.6 09/26/2020   INSULIN 13.2 05/23/2020   Lab Results  Component Value Date   TSH 1.640 05/23/2020   Lab Results  Component Value Date   CHOL 145 09/26/2020   HDL 49 09/26/2020   LDLCALC 82 09/26/2020   TRIG 71 09/26/2020   CHOLHDL 2.9 05/23/2020   Lab Results  Component Value Date   WBC 5.8 09/26/2020   HGB 11.5 09/26/2020   HCT 36.2 09/26/2020   MCV 79 09/26/2020   PLT 370 09/26/2020   Lab Results  Component Value Date   IRON 35 09/26/2020   TIBC 388 09/26/2020   FERRITIN 8 (L) 09/26/2020   Attestation Statements:   Reviewed by clinician on day of visit: allergies, medications, problem list, medical history, surgical history, family history, social history, and previous encounter notes.   Trude Mcburney, am acting as Energy manager for [place], FNP-C.  I have reviewed the above documentation for accuracy and completeness, and I agree with the above. -  Jesse Sans, FNP

## 2020-12-01 ENCOUNTER — Encounter (INDEPENDENT_AMBULATORY_CARE_PROVIDER_SITE_OTHER): Payer: Self-pay | Admitting: Family Medicine

## 2020-12-05 ENCOUNTER — Ambulatory Visit (INDEPENDENT_AMBULATORY_CARE_PROVIDER_SITE_OTHER): Payer: BC Managed Care – PPO | Admitting: Family Medicine

## 2020-12-12 ENCOUNTER — Encounter (INDEPENDENT_AMBULATORY_CARE_PROVIDER_SITE_OTHER): Payer: Self-pay | Admitting: Family Medicine

## 2020-12-12 ENCOUNTER — Other Ambulatory Visit: Payer: Self-pay

## 2020-12-12 ENCOUNTER — Ambulatory Visit (INDEPENDENT_AMBULATORY_CARE_PROVIDER_SITE_OTHER): Payer: BC Managed Care – PPO | Admitting: Family Medicine

## 2020-12-12 VITALS — BP 104/66 | HR 80 | Temp 97.9°F | Ht 65.0 in | Wt 208.0 lb

## 2020-12-12 DIAGNOSIS — F3289 Other specified depressive episodes: Secondary | ICD-10-CM | POA: Diagnosis not present

## 2020-12-12 DIAGNOSIS — E559 Vitamin D deficiency, unspecified: Secondary | ICD-10-CM | POA: Diagnosis not present

## 2020-12-12 DIAGNOSIS — Z6835 Body mass index (BMI) 35.0-35.9, adult: Secondary | ICD-10-CM

## 2020-12-12 DIAGNOSIS — Z9189 Other specified personal risk factors, not elsewhere classified: Secondary | ICD-10-CM

## 2020-12-12 MED ORDER — VITAMIN D (ERGOCALCIFEROL) 1.25 MG (50000 UNIT) PO CAPS: 50000.0000 [IU] | ORAL_CAPSULE | ORAL | 0 refills | Status: DC

## 2020-12-12 MED ORDER — BUPROPION HCL ER (SR) 150 MG PO TB12: 150.0000 mg | ORAL_TABLET | Freq: Two times a day (BID) | ORAL | 0 refills | Status: DC

## 2020-12-12 MED ORDER — WEGOVY 0.5 MG/0.5ML ~~LOC~~ SOAJ: 0.5000 mg | SUBCUTANEOUS | 0 refills | Status: DC

## 2020-12-13 NOTE — Telephone Encounter (Signed)
Last OV with Dawn 

## 2020-12-17 ENCOUNTER — Encounter (INDEPENDENT_AMBULATORY_CARE_PROVIDER_SITE_OTHER): Payer: Self-pay | Admitting: Family Medicine

## 2020-12-17 NOTE — Progress Notes (Signed)
Chief Complaint:   OBESITY Donna Marquez is here to discuss her progress with her obesity treatment plan along with follow-up of her obesity related diagnoses. Donna Marquez is on the Category 3 Plan and states she is following her eating plan approximately 0% of the time. Donna Marquez states she is doing cardio resistance 2-3 times per week.   Today's visit was #: 10 Starting weight: 240 lbs Starting date: 05/23/2020 Today's weight: 208 lbs Today's date: 12/12/2020 Total lbs lost to date: 32 lbs Total lbs lost since last in-office visit: 7 lbs  Interim History: Donna Marquez did well over the holidays and has lost 7 lbs. She is on Wegovy and appetite is well controlled. Donna Marquez has been journalling more than following Cat 3. She is down 32 lbs. Donna Marquez is status post sleeve gastrectomy (2018).  Subjective:   1. Vitamin D deficiency [place]herrie's Vitamin D level is nearly at goal at 48.. She is currently taking prescription vitamin D 50,000 IU each week.   2. Other depression with emotional eating Donna Marquez's mood is stable and cravings are well controlled on bupropion.  3. At risk for impaired metabolic function Donna Marquez is at increased risk for impaired metabolic function due to current nutrition: possible inadequate protein, status post sleeve gastrectomy.   Assessment/Plan:   1. Vitamin D deficiency  She agrees to continue to take prescription Vitamin D @50 ,000 IU every week and will follow-up for routine testing of Vitamin D, at least 2-3 times per year to avoid over-replacement. We will refill Vitamin D prescription 50,000 unit weekly #4. No refill.  - Vitamin D, Ergocalciferol, (DRISDOL) 1.25 MG (50000 UNIT) CAPS capsule; Take 1 capsule (50,000 Units total) by mouth every 7 (seven) days.  Dispense: 4 capsule; Refill: 0  2. Other depression with emotional eating  Prescription for bupropion 150 mg BID #60. No refill.  - buPROPion (WELLBUTRIN SR) 150 MG 12 hr tablet; Take 1 tablet (150 mg total) by mouth 2  (two) times daily.  Dispense: 60 tablet; Refill: 0  3. At risk for impaired metabolic function Donna Marquez was given approximately 15 minutes of impaired  metabolic function prevention counseling today. We discussed intensive lifestyle modifications today with an emphasis on specific nutrition and exercise instructions and strategies.   Repetitive spaced learning was employed today to elicit superior memory formation and behavioral change.  4. Class 2 severe obesity with serious comorbidity and body mass index (BMI) of 35.0 to 35.9 in adult, unspecified obesity type (HCC)  Donna Marquez is currently in the action stage of change. As such, her goal is to continue with weight loss efforts. She has agreed to keeping a food journal and adhering to recommended goals of 1400-1600 calories and 100 grams of  protein daily.   - Semaglutide-Weight Management (WEGOVY) 0.5 MG/0.5ML SOAJ; Inject 0.5 mg into the skin once a week.  Dispense: 2 mL; Refill: 0  Exercise goals: As is.  Behavioral modification strategies: increasing lean protein intake, decreasing simple carbohydrates and keeping a strict food journal.  Donna Marquez has agreed to follow-up with our clinic in 3 weeks. She was informed of the importance of frequent follow-up visits to maximize her success with intensive lifestyle modifications for her multiple health conditions.     Objective:   Blood pressure 104/66, pulse 80, temperature 97.9 F (36.6 C), height 5\' 5"  (1.651 m), weight 208 lb (94.3 kg), SpO2 97 %, not currently breastfeeding. Body mass index is 34.61 kg/m.  General: Cooperative, alert, well developed, in no acute distress. HEENT: Conjunctivae  and lids unremarkable. Cardiovascular: Regular rhythm.  Lungs: Normal work of breathing. Neurologic: No focal deficits.   Lab Results  Component Value Date   CREATININE 0.71 05/23/2020   BUN 9 05/23/2020   NA 138 05/23/2020   K 4.5 05/23/2020   CL 100 05/23/2020   CO2 25 05/23/2020   Lab  Results  Component Value Date   ALT 9 05/23/2020   AST 13 05/23/2020   ALKPHOS 107 05/23/2020   BILITOT 0.4 05/23/2020   Lab Results  Component Value Date   HGBA1C 5.6 09/26/2020   HGBA1C 5.4 05/23/2020   Lab Results  Component Value Date   INSULIN 16.6 09/26/2020   INSULIN 13.2 05/23/2020   Lab Results  Component Value Date   TSH 1.640 05/23/2020   Lab Results  Component Value Date   CHOL 145 09/26/2020   HDL 49 09/26/2020   LDLCALC 82 09/26/2020   TRIG 71 09/26/2020   CHOLHDL 2.9 05/23/2020   Lab Results  Component Value Date   WBC 5.8 09/26/2020   HGB 11.5 09/26/2020   HCT 36.2 09/26/2020   MCV 79 09/26/2020   PLT 370 09/26/2020   Lab Results  Component Value Date   IRON 35 09/26/2020   TIBC 388 09/26/2020   FERRITIN 8 (L) 09/26/2020      Attestation Statements:   Reviewed by clinician on day of visit: allergies, medications, problem list, medical history, surgical history, family history, social history, and previous encounter notes.    IDelorse Limber, am acting as Energy manager for [employer], FNP.  I have reviewed the above documentation for accuracy and completeness, and I agree with the above. -  Jesse Sans, FNP

## 2020-12-31 ENCOUNTER — Encounter (INDEPENDENT_AMBULATORY_CARE_PROVIDER_SITE_OTHER): Payer: Self-pay | Admitting: Family Medicine

## 2021-01-03 ENCOUNTER — Telehealth (INDEPENDENT_AMBULATORY_CARE_PROVIDER_SITE_OTHER): Payer: BC Managed Care – PPO | Admitting: Family Medicine

## 2021-01-11 ENCOUNTER — Encounter (INDEPENDENT_AMBULATORY_CARE_PROVIDER_SITE_OTHER): Payer: Self-pay

## 2021-01-11 DIAGNOSIS — D509 Iron deficiency anemia, unspecified: Secondary | ICD-10-CM | POA: Insufficient documentation

## 2021-01-30 ENCOUNTER — Other Ambulatory Visit (INDEPENDENT_AMBULATORY_CARE_PROVIDER_SITE_OTHER): Payer: Self-pay | Admitting: Family Medicine

## 2021-01-30 DIAGNOSIS — F3289 Other specified depressive episodes: Secondary | ICD-10-CM

## 2021-01-30 DIAGNOSIS — E559 Vitamin D deficiency, unspecified: Secondary | ICD-10-CM

## 2021-01-30 MED ORDER — VITAMIN D (ERGOCALCIFEROL) 1.25 MG (50000 UNIT) PO CAPS: 50000.0000 [IU] | ORAL_CAPSULE | ORAL | 0 refills | Status: DC

## 2021-01-30 MED ORDER — BUPROPION HCL ER (SR) 150 MG PO TB12: 150.0000 mg | ORAL_TABLET | Freq: Two times a day (BID) | ORAL | 0 refills | Status: DC

## 2021-01-30 NOTE — Telephone Encounter (Signed)
Refill request

## 2021-02-06 ENCOUNTER — Encounter (INDEPENDENT_AMBULATORY_CARE_PROVIDER_SITE_OTHER): Payer: Self-pay | Admitting: Family Medicine

## 2021-02-06 DIAGNOSIS — R632 Polyphagia: Secondary | ICD-10-CM

## 2021-02-06 MED ORDER — WEGOVY 1 MG/0.5ML ~~LOC~~ SOAJ: 1.0000 mg | SUBCUTANEOUS | 0 refills | Status: DC

## 2021-02-06 NOTE — Telephone Encounter (Signed)
Prescription request. Pt canceled last OV that was scheduled 01/02/21.

## 2021-02-18 ENCOUNTER — Ambulatory Visit (INDEPENDENT_AMBULATORY_CARE_PROVIDER_SITE_OTHER): Payer: BC Managed Care – PPO | Admitting: Family Medicine

## 2021-02-18 ENCOUNTER — Encounter (INDEPENDENT_AMBULATORY_CARE_PROVIDER_SITE_OTHER): Payer: Self-pay | Admitting: Family Medicine

## 2021-02-18 ENCOUNTER — Other Ambulatory Visit: Payer: Self-pay

## 2021-02-18 VITALS — BP 114/69 | HR 84 | Temp 97.9°F | Ht 65.0 in | Wt 204.0 lb

## 2021-02-18 DIAGNOSIS — Z6839 Body mass index (BMI) 39.0-39.9, adult: Secondary | ICD-10-CM

## 2021-02-18 DIAGNOSIS — Z9189 Other specified personal risk factors, not elsewhere classified: Secondary | ICD-10-CM | POA: Diagnosis not present

## 2021-02-18 DIAGNOSIS — Z6833 Body mass index (BMI) 33.0-33.9, adult: Secondary | ICD-10-CM

## 2021-02-18 DIAGNOSIS — E559 Vitamin D deficiency, unspecified: Secondary | ICD-10-CM | POA: Diagnosis not present

## 2021-02-18 DIAGNOSIS — F3289 Other specified depressive episodes: Secondary | ICD-10-CM

## 2021-02-18 DIAGNOSIS — L659 Nonscarring hair loss, unspecified: Secondary | ICD-10-CM | POA: Diagnosis not present

## 2021-02-18 DIAGNOSIS — E669 Obesity, unspecified: Secondary | ICD-10-CM

## 2021-02-18 MED ORDER — BUPROPION HCL ER (SR) 150 MG PO TB12: 150.0000 mg | ORAL_TABLET | Freq: Two times a day (BID) | ORAL | 0 refills | Status: DC

## 2021-02-18 MED ORDER — VITAMIN D (ERGOCALCIFEROL) 1.25 MG (50000 UNIT) PO CAPS: 50000.0000 [IU] | ORAL_CAPSULE | ORAL | 0 refills | Status: DC

## 2021-02-19 NOTE — Progress Notes (Signed)
Chief Complaint:   OBESITY Donna Marquez is here to discuss her progress with her obesity treatment plan along with follow-up of her obesity related diagnoses. Donna Marquez is on keeping a food journal and adhering to recommended goals of 1400-1600 calories and 100 grams of protein and states she is following her eating plan approximately 50% of the time. Donna Marquez states she is strength training and walking for 45 minutes 1-2 times per week.  Today's visit was #: 11 Starting weight: 240 lbs Starting date: 05/23/2020 Today's weight: 204 lbs Today's date: 02/18/2021 Total lbs lost to date: 36 lbs Total lbs lost since last in-office visit: 4 lbs  Interim History: Colbie has not been in for an office visit since January 12.  She is down 4 pounds today.  She switched back to Category 3 because her weight had plateaued.  She is on Wegovy 1 mg and notes appetite suppression.  Endorses nausea, denies constipation.  She says she does not get in enough protein.  Subjective:   1. Vitamin D deficiency Vitamin D nearly at goal (48).  On weekly prescription vitamin D.  2. Hair loss Notes hair is thinning around temples.  She has started taking collagen.  3. Other depression with emotional eating Mood stable.  She is on bupropion 150 mg twice daily.  4. At risk for deficient intake of food The patient is at a higher than average risk of deficient intake of food due to history of sleep gastrectomy.  Assessment/Plan:   1. Vitamin D deficiency Refill vitamin 50,000 IU weekly, as per below.  - Refill Vitamin D, Ergocalciferol, (DRISDOL) 1.25 MG (50000 UNIT) CAPS capsule; Take 1 capsule (50,000 Units total) by mouth every 7 (seven) days.  Dispense: 4 capsule; Refill: 0  2. Hair loss Check thyroid panel at next office visit.  Increase protein.  3. Other depression with emotional eating Refill bupropion 150 mg twice daily.  - Refill buPROPion (WELLBUTRIN SR) 150 MG 12 hr tablet; Take 1 tablet (150 mg  total) by mouth 2 (two) times daily.  Dispense: 60 tablet; Refill: 0  4. At risk for deficient intake of food Donna Marquez was given approximately 15 minutes of deficit intake of food prevention counseling today. Donna Marquez is at risk for eating too few calories based on current food recall. She was encouraged to focus on meeting caloric and protein goals according to her recommended meal plan.   5. Class 1 obesity with serious comorbidity and body mass index (BMI) of 33.0 to 33.9 in adult, unspecified obesity type  Donna Marquez is currently in the action stage of change. As such, her goal is to continue with weight loss efforts. She has agreed to the Category 3 Plan.   Continue Wegovy 1 mg subcutaneously weekly.  Exercise goals: Increase exercise frequency if possible.  Behavioral modification strategies: increasing lean protein intake and better snacking choices.  Donna Marquez has agreed to follow-up with our clinic in 3 weeks, fasting.  Objective:   Blood pressure 114/69, pulse 84, temperature 97.9 F (36.6 C), height 5\' 5"  (1.651 m), weight 204 lb (92.5 kg), SpO2 100 %, not currently breastfeeding. Body mass index is 33.95 kg/m.  General: Cooperative, alert, well developed, in no acute distress. HEENT: Conjunctivae and lids unremarkable. Cardiovascular: Regular rhythm.  Lungs: Normal work of breathing. Neurologic: No focal deficits.   Lab Results  Component Value Date   CREATININE 0.71 05/23/2020   BUN 9 05/23/2020   NA 138 05/23/2020   K 4.5 05/23/2020  CL 100 05/23/2020   CO2 25 05/23/2020   Lab Results  Component Value Date   ALT 9 05/23/2020   AST 13 05/23/2020   ALKPHOS 107 05/23/2020   BILITOT 0.4 05/23/2020   Lab Results  Component Value Date   HGBA1C 5.6 09/26/2020   HGBA1C 5.4 05/23/2020   Lab Results  Component Value Date   INSULIN 16.6 09/26/2020   INSULIN 13.2 05/23/2020   Lab Results  Component Value Date   TSH 1.640 05/23/2020   Lab Results  Component Value  Date   CHOL 145 09/26/2020   HDL 49 09/26/2020   LDLCALC 82 09/26/2020   TRIG 71 09/26/2020   CHOLHDL 2.9 05/23/2020   Lab Results  Component Value Date   WBC 5.8 09/26/2020   HGB 11.5 09/26/2020   HCT 36.2 09/26/2020   MCV 79 09/26/2020   PLT 370 09/26/2020   Lab Results  Component Value Date   IRON 35 09/26/2020   TIBC 388 09/26/2020   FERRITIN 8 (L) 09/26/2020   Attestation Statements:   Reviewed by clinician on day of visit: allergies, medications, problem list, medical history, surgical history, family history, social history, and previous encounter notes.  I, Insurance claims handler, CMA, am acting as Energy manager for [place], FNP.  I have reviewed the above documentation for accuracy and completeness, and I agree with the above. -  Jesse Sans, FNP

## 2021-02-20 ENCOUNTER — Encounter (INDEPENDENT_AMBULATORY_CARE_PROVIDER_SITE_OTHER): Payer: Self-pay | Admitting: Family Medicine

## 2021-03-12 ENCOUNTER — Ambulatory Visit (INDEPENDENT_AMBULATORY_CARE_PROVIDER_SITE_OTHER): Payer: BC Managed Care – PPO | Admitting: Family Medicine

## 2021-03-12 ENCOUNTER — Encounter (INDEPENDENT_AMBULATORY_CARE_PROVIDER_SITE_OTHER): Payer: Self-pay | Admitting: Family Medicine

## 2021-03-12 ENCOUNTER — Other Ambulatory Visit: Payer: Self-pay

## 2021-03-12 VITALS — BP 111/68 | HR 77 | Temp 98.1°F | Ht 65.0 in | Wt 201.0 lb

## 2021-03-12 DIAGNOSIS — D508 Other iron deficiency anemias: Secondary | ICD-10-CM | POA: Diagnosis not present

## 2021-03-12 DIAGNOSIS — E559 Vitamin D deficiency, unspecified: Secondary | ICD-10-CM

## 2021-03-12 DIAGNOSIS — K912 Postsurgical malabsorption, not elsewhere classified: Secondary | ICD-10-CM | POA: Diagnosis not present

## 2021-03-12 DIAGNOSIS — L659 Nonscarring hair loss, unspecified: Secondary | ICD-10-CM

## 2021-03-12 DIAGNOSIS — K5903 Drug induced constipation: Secondary | ICD-10-CM

## 2021-03-12 DIAGNOSIS — E88819 Insulin resistance, unspecified: Secondary | ICD-10-CM

## 2021-03-12 DIAGNOSIS — Z79899 Other long term (current) drug therapy: Secondary | ICD-10-CM | POA: Diagnosis not present

## 2021-03-12 DIAGNOSIS — E8881 Metabolic syndrome: Secondary | ICD-10-CM

## 2021-03-12 DIAGNOSIS — F3289 Other specified depressive episodes: Secondary | ICD-10-CM

## 2021-03-12 DIAGNOSIS — Z9189 Other specified personal risk factors, not elsewhere classified: Secondary | ICD-10-CM | POA: Diagnosis not present

## 2021-03-12 DIAGNOSIS — Z6839 Body mass index (BMI) 39.0-39.9, adult: Secondary | ICD-10-CM

## 2021-03-12 MED ORDER — BUPROPION HCL ER (SR) 150 MG PO TB12
150.0000 mg | ORAL_TABLET | Freq: Two times a day (BID) | ORAL | 0 refills | Status: DC
Start: 2021-03-12 — End: 2021-05-21

## 2021-03-12 MED ORDER — VITAMIN D (ERGOCALCIFEROL) 1.25 MG (50000 UNIT) PO CAPS: 50000.0000 [IU] | ORAL_CAPSULE | ORAL | 0 refills | Status: DC

## 2021-03-12 MED ORDER — WEGOVY 1.7 MG/0.75ML ~~LOC~~ SOAJ: 1.7000 mg | SUBCUTANEOUS | 0 refills | Status: DC

## 2021-03-13 LAB — COMPREHENSIVE METABOLIC PANEL
ALT: 11 IU/L (ref 0–32)
AST: 18 IU/L (ref 0–40)
Albumin/Globulin Ratio: 2 (ref 1.2–2.2)
Albumin: 4.7 g/dL (ref 3.9–5.0)
Alkaline Phosphatase: 81 IU/L (ref 44–121)
BUN/Creatinine Ratio: 12 (ref 9–23)
BUN: 9 mg/dL (ref 6–20)
Bilirubin Total: 0.4 mg/dL (ref 0.0–1.2)
CO2: 24 mmol/L (ref 20–29)
Calcium: 9.4 mg/dL (ref 8.7–10.2)
Chloride: 102 mmol/L (ref 96–106)
Creatinine, Ser: 0.75 mg/dL (ref 0.57–1.00)
Globulin, Total: 2.3 g/dL (ref 1.5–4.5)
Glucose: 82 mg/dL (ref 65–99)
Potassium: 4.7 mmol/L (ref 3.5–5.2)
Sodium: 139 mmol/L (ref 134–144)
Total Protein: 7 g/dL (ref 6.0–8.5)
eGFR: 110 mL/min/{1.73_m2} (ref >59)

## 2021-03-13 LAB — CBC WITH DIFFERENTIAL/PLATELET
Basophils Absolute: 0.1 10*3/uL (ref 0.0–0.2)
Basos: 1 %
EOS (ABSOLUTE): 0.1 10*3/uL (ref 0.0–0.4)
Eos: 1 %
Hematocrit: 39.2 % (ref 34.0–46.6)
Hemoglobin: 12.1 g/dL (ref 11.1–15.9)
Immature Grans (Abs): 0 10*3/uL (ref 0.0–0.1)
Immature Granulocytes: 0 %
Lymphocytes Absolute: 2.2 10*3/uL (ref 0.7–3.1)
Lymphs: 46 %
MCH: 26.7 pg (ref 26.6–33.0)
MCHC: 30.9 g/dL — ABNORMAL LOW (ref 31.5–35.7)
MCV: 86 fL (ref 79–97)
Monocytes Absolute: 0.4 10*3/uL (ref 0.1–0.9)
Monocytes: 9 %
Neutrophils Absolute: 2.1 10*3/uL (ref 1.4–7.0)
Neutrophils: 43 %
Platelets: 322 10*3/uL (ref 150–450)
RBC: 4.54 x10E6/uL (ref 3.77–5.28)
RDW: 14.9 % (ref 11.7–15.4)
WBC: 4.9 10*3/uL (ref 3.4–10.8)

## 2021-03-13 LAB — FERRITIN: Ferritin: 16 ng/mL (ref 15–150)

## 2021-03-13 LAB — INSULIN, RANDOM: INSULIN: 13.8 u[IU]/mL (ref 2.6–24.9)

## 2021-03-13 LAB — FOLATE: Folate: 16.1 ng/mL (ref >3.0)

## 2021-03-13 LAB — VITAMIN D 25 HYDROXY (VIT D DEFICIENCY, FRACTURES): Vit D, 25-Hydroxy: 44.9 ng/mL (ref 30.0–100.0)

## 2021-03-13 LAB — VITAMIN B12: Vitamin B-12: 498 pg/mL (ref 232–1245)

## 2021-03-13 LAB — HEMOGLOBIN A1C
Est. average glucose Bld gHb Est-mCnc: 100 mg/dL
Hgb A1c MFr Bld: 5.1 % (ref 4.8–5.6)

## 2021-03-13 LAB — T4, FREE: Free T4: 1.21 ng/dL (ref 0.82–1.77)

## 2021-03-13 LAB — T3: T3, Total: 94 ng/dL (ref 71–180)

## 2021-03-13 LAB — TSH: TSH: 1.38 u[IU]/mL (ref 0.450–4.500)

## 2021-03-13 LAB — IRON AND TIBC
Iron Saturation: 16 % (ref 15–55)
Iron: 58 ug/dL (ref 27–159)
Total Iron Binding Capacity: 361 ug/dL (ref 250–450)
UIBC: 303 ug/dL (ref 131–425)

## 2021-03-14 ENCOUNTER — Encounter (INDEPENDENT_AMBULATORY_CARE_PROVIDER_SITE_OTHER): Payer: Self-pay | Admitting: Family Medicine

## 2021-03-14 NOTE — Progress Notes (Signed)
Chief Complaint:   OBESITY Donna Marquez is here to discuss her progress with her obesity treatment plan along with follow-up of her obesity related diagnoses. Donna Marquez is on the Category 3 Plan and states she is following her eating plan approximately 30% of the time. Donna Marquez states she is doing cardio, going to the gym, and walking for 45 minutes 2-3 times per week.  Today's visit was #: 12 Starting weight: 240 lbs Starting date: 05/23/2020 Today's weight: 201 lbs Today's date: 03/12/2021 Total lbs lost to date: 39 lbs Total lbs lost since last in-office visit: 3 lbs  Interim History: Donna Marquez is under increased stress due to her job situation which affects her eating - either too much or not enough.  She is on plan at breakfast.  She is skipping some meals - lunch and dinner at times.  Water intake is good.  She endorses constipation.  She also had recent dental surgery.    Subjective:   1. Other iron deficiency anemia Last iron saturation and ferritin low.  On iron 325 daily.  History of sleeve gastrectomy.  CBC Latest Ref Rng & Units 03/12/2021 09/26/2020 05/23/2020  WBC 3.4 - 10.8 x10E3/uL 4.9 5.8 5.8  Hemoglobin 11.1 - 15.9 g/dL 00.7 62.2 63.3  Hematocrit 34.0 - 46.6 % 39.2 36.2 37.6  Platelets 150 - 450 x10E3/uL 322 370 396   Lab Results  Component Value Date   IRON 58 03/12/2021   TIBC 361 03/12/2021   FERRITIN 16 03/12/2021   Lab Results  Component Value Date   VITAMINB12 498 03/12/2021   2. Hair loss She notes hair loss over the last few months.  She is using a collagen powder and is taking a prenatal multivitamin.  3. Vitamin D deficiency Vitamin D nearly at goal (48).  On weekly prescription vitamin D.  4. Insulin resistance On Wegovy.  Last fasting insulin elevated at 16.6.  Lab Results  Component Value Date   INSULIN 13.8 03/12/2021   INSULIN 16.6 09/26/2020   INSULIN 13.2 05/23/2020   Lab Results  Component Value Date   HGBA1C 5.1 03/12/2021   5.  Drug-induced constipation On Wegovy.  Using stool softener every other day.  6. Other depression with emotional eating Mood stable despite stress.  7. At risk for deficient intake of food The patient is at a higher than average risk of deficient intake of food due to skipping meals and history of sleeve gastrectomy.  Assessment/Plan:   1. Other iron deficiency anemia Check CBC and anemia panel today.  Continue iron.  - Vitamin B12 - Folate - Iron and TIBC - Ferritin - CBC with Differential/Platelet  2. Hair loss Check thyroid panel today. - T3 - T4, free - TSH  3. Vitamin D deficiency Refill vitamin D 50,000 IU weekly, as per below.  - VITAMIN D 25 Hydroxy (Vit-D Deficiency, Fractures) - Refill Vitamin D, Ergocalciferol, (DRISDOL) 1.25 MG (50000 UNIT) CAPS capsule; Take 1 capsule (50,000 Units total) by mouth every 7 (seven) days.  Dispense: 4 capsule; Refill: 0  4. Insulin resistance Check insulin, fasting glucose, and A1c today.  - Comprehensive metabolic panel - Hemoglobin A1c - Insulin, random  5. Drug-induced constipation MiraLAX daily as needed.  Continue stool softener.  May take daily.  6. Other depression with emotional eating Refill bupropion 150 mg daily, as per below.  - Refill buPROPion (WELLBUTRIN SR) 150 MG 12 hr tablet; Take 1 tablet (150 mg total) by mouth 2 (two) times daily.  Dispense: 60 tablet; Refill: 0  7. At risk for deficient intake of food Donna Marquez was given approximately 15 minutes of deficit intake of food prevention counseling today. Donna Marquez is at risk for eating too few calories based on current food recall. She was encouraged to focus on meeting caloric and protein goals according to her recommended meal plan.   8. Obesity: Current BMI 33  Increase dose of Wegovy to 1.7 mg subcutaneously weekly.  - Increase Semaglutide-Weight Management (WEGOVY) 1.7 MG/0.75ML SOAJ; Inject 1.7 mg into the skin once a week.  Dispense: 3 mL; Refill:  0  Donna Marquez is currently in the action stage of change. As such, her goal is to continue with weight loss efforts. She has agreed to the Category 3 Plan.   Exercise goals: As is.  Behavioral modification strategies: increasing lean protein intake and no skipping meals.  Donna Marquez has agreed to follow-up with our clinic in 3-4 weeks. She was informed of the importance of frequent follow-up visits to maximize her success with intensive lifestyle modifications for her multiple health conditions.   Donna Marquez was informed we would discuss her lab results at her next visit unless there is a critical issue that needs to be addressed sooner. Donna Marquez agreed to keep her next visit at the agreed upon time to discuss these results.  Objective:   Blood pressure 111/68, pulse 77, temperature 98.1 F (36.7 C), height 5\' 5"  (1.651 m), weight 201 lb (91.2 kg), SpO2 100 %, not currently breastfeeding. Body mass index is 33.45 kg/m.  General: Cooperative, alert, well developed, in no acute distress. HEENT: Conjunctivae and lids unremarkable. Cardiovascular: Regular rhythm.  Lungs: Normal work of breathing. Neurologic: No focal deficits.   Lab Results  Component Value Date   CREATININE 0.75 03/12/2021   BUN 9 03/12/2021   NA 139 03/12/2021   K 4.7 03/12/2021   CL 102 03/12/2021   CO2 24 03/12/2021   Lab Results  Component Value Date   ALT 11 03/12/2021   AST 18 03/12/2021   ALKPHOS 81 03/12/2021   BILITOT 0.4 03/12/2021   Lab Results  Component Value Date   HGBA1C 5.1 03/12/2021   HGBA1C 5.6 09/26/2020   HGBA1C 5.4 05/23/2020   Lab Results  Component Value Date   INSULIN 13.8 03/12/2021   INSULIN 16.6 09/26/2020   INSULIN 13.2 05/23/2020   Lab Results  Component Value Date   TSH 1.380 03/12/2021   Lab Results  Component Value Date   CHOL 145 09/26/2020   HDL 49 09/26/2020   LDLCALC 82 09/26/2020   TRIG 71 09/26/2020   CHOLHDL 2.9 05/23/2020   Lab Results  Component Value Date    WBC 4.9 03/12/2021   HGB 12.1 03/12/2021   HCT 39.2 03/12/2021   MCV 86 03/12/2021   PLT 322 03/12/2021   Lab Results  Component Value Date   IRON 58 03/12/2021   TIBC 361 03/12/2021   FERRITIN 16 03/12/2021   Attestation Statements:   Reviewed by clinician on day of visit: allergies, medications, problem list, medical history, surgical history, family history, social history, and previous encounter notes.  I, 05/12/2021, CMA, am acting as Insurance claims handler for Energy manager, FNP.  I have reviewed the above documentation for accuracy and completeness, and I agree with the above. -  [place], FNP

## 2021-03-25 ENCOUNTER — Other Ambulatory Visit (INDEPENDENT_AMBULATORY_CARE_PROVIDER_SITE_OTHER): Payer: Self-pay | Admitting: Family Medicine

## 2021-03-25 DIAGNOSIS — E66812 Obesity, class 2: Secondary | ICD-10-CM

## 2021-04-01 NOTE — Telephone Encounter (Signed)
For you -

## 2021-04-02 ENCOUNTER — Encounter (INDEPENDENT_AMBULATORY_CARE_PROVIDER_SITE_OTHER): Payer: Self-pay | Admitting: Family Medicine

## 2021-04-02 MED ORDER — WEGOVY 1.7 MG/0.75ML ~~LOC~~ SOAJ: 1.7000 mg | SUBCUTANEOUS | 0 refills | Status: DC

## 2021-04-02 NOTE — Telephone Encounter (Signed)
Please address

## 2021-04-04 ENCOUNTER — Encounter (INDEPENDENT_AMBULATORY_CARE_PROVIDER_SITE_OTHER): Payer: Self-pay

## 2021-04-04 ENCOUNTER — Ambulatory Visit (INDEPENDENT_AMBULATORY_CARE_PROVIDER_SITE_OTHER): Payer: BC Managed Care – PPO | Admitting: Family Medicine

## 2021-04-23 ENCOUNTER — Encounter (INDEPENDENT_AMBULATORY_CARE_PROVIDER_SITE_OTHER): Payer: Self-pay | Admitting: Family Medicine

## 2021-04-24 NOTE — Telephone Encounter (Signed)
Pt last seen by Dawn Whitmire, FNP.  

## 2021-05-15 ENCOUNTER — Other Ambulatory Visit (INDEPENDENT_AMBULATORY_CARE_PROVIDER_SITE_OTHER): Payer: Self-pay | Admitting: Family Medicine

## 2021-05-15 DIAGNOSIS — F3289 Other specified depressive episodes: Secondary | ICD-10-CM

## 2021-05-15 DIAGNOSIS — E559 Vitamin D deficiency, unspecified: Secondary | ICD-10-CM

## 2021-05-21 ENCOUNTER — Telehealth (INDEPENDENT_AMBULATORY_CARE_PROVIDER_SITE_OTHER): Payer: Self-pay

## 2021-05-21 ENCOUNTER — Other Ambulatory Visit (INDEPENDENT_AMBULATORY_CARE_PROVIDER_SITE_OTHER): Payer: Self-pay

## 2021-05-21 ENCOUNTER — Encounter (INDEPENDENT_AMBULATORY_CARE_PROVIDER_SITE_OTHER): Payer: Self-pay | Admitting: Family Medicine

## 2021-05-21 DIAGNOSIS — F3289 Other specified depressive episodes: Secondary | ICD-10-CM

## 2021-05-21 DIAGNOSIS — E559 Vitamin D deficiency, unspecified: Secondary | ICD-10-CM

## 2021-05-21 MED ORDER — BUPROPION HCL ER (SR) 150 MG PO TB12: 150.0000 mg | ORAL_TABLET | Freq: Two times a day (BID) | ORAL | 0 refills | Status: DC

## 2021-05-21 MED ORDER — WEGOVY 1.7 MG/0.75ML ~~LOC~~ SOAJ: 1.7000 mg | SUBCUTANEOUS | 0 refills | Status: DC

## 2021-05-21 NOTE — Telephone Encounter (Signed)
Refill request

## 2021-05-21 NOTE — Addendum Note (Signed)
Addended by: Teressa Senter on: 05/21/2021 10:38 AM   Modules accepted: Orders

## 2021-05-21 NOTE — Telephone Encounter (Signed)
Prior Berkley Harvey has been submitted for the [facility] Waiting for a response  [employer] is reviewing your PA request. Typically an electronic response will be received within 24-72 hours. To check for an update later, open this request from your dashboard.  You may close this dialog and return to your dashboard to perform other tasks.

## 2021-05-22 NOTE — Telephone Encounter (Signed)
Prior auth for Donna Marquez has been denied  Why was my request denied? This request was denied because you did not meet the following clinical requirements: The requested medication and/or diagnosis are not a covered benefit and excluded from coverage in accordance with the terms and conditions of your plan benefit. Therefore, the request has been administratively denied. The requested medication and/or diagnosis are not a covered benefit and are excluded from coverage in accordance with the terms and conditions of your plan benefit. Therefore, this request has been administratively denied  My chart message sent to the patient.

## 2021-06-13 ENCOUNTER — Encounter (INDEPENDENT_AMBULATORY_CARE_PROVIDER_SITE_OTHER): Payer: Self-pay | Admitting: Family Medicine

## 2021-06-13 ENCOUNTER — Ambulatory Visit (INDEPENDENT_AMBULATORY_CARE_PROVIDER_SITE_OTHER): Payer: 59 | Admitting: Family Medicine

## 2021-06-13 ENCOUNTER — Other Ambulatory Visit: Payer: Self-pay

## 2021-06-13 VITALS — BP 96/62 | HR 80 | Temp 97.9°F | Ht 65.0 in | Wt 203.0 lb

## 2021-06-13 DIAGNOSIS — R7303 Prediabetes: Secondary | ICD-10-CM | POA: Diagnosis not present

## 2021-06-13 DIAGNOSIS — F3289 Other specified depressive episodes: Secondary | ICD-10-CM | POA: Diagnosis not present

## 2021-06-13 DIAGNOSIS — Z9189 Other specified personal risk factors, not elsewhere classified: Secondary | ICD-10-CM | POA: Diagnosis not present

## 2021-06-13 DIAGNOSIS — E559 Vitamin D deficiency, unspecified: Secondary | ICD-10-CM

## 2021-06-13 DIAGNOSIS — Z6839 Body mass index (BMI) 39.0-39.9, adult: Secondary | ICD-10-CM

## 2021-06-13 MED ORDER — OZEMPIC (0.25 OR 0.5 MG/DOSE) 2 MG/1.5ML ~~LOC~~ SOPN: 0.5000 mg | PEN_INJECTOR | SUBCUTANEOUS | 0 refills | Status: AC

## 2021-06-13 MED ORDER — BUPROPION HCL ER (SR) 150 MG PO TB12: 150.0000 mg | ORAL_TABLET | Freq: Two times a day (BID) | ORAL | 0 refills | Status: AC

## 2021-06-13 MED ORDER — VITAMIN D (ERGOCALCIFEROL) 1.25 MG (50000 UNIT) PO CAPS: 50000.0000 [IU] | ORAL_CAPSULE | ORAL | 0 refills | Status: AC

## 2021-06-18 NOTE — Telephone Encounter (Signed)
Please advise, thank you.

## 2021-06-19 ENCOUNTER — Encounter (INDEPENDENT_AMBULATORY_CARE_PROVIDER_SITE_OTHER): Payer: Self-pay

## 2021-06-19 NOTE — Telephone Encounter (Signed)
Patient was denied due to not having type 2 diabetes. I am unable to answer this question. Patient was informed to discuss with provider.   Thanks, [employer]

## 2021-06-19 NOTE — Progress Notes (Signed)
Chief Complaint:   OBESITY Donna Marquez is here to discuss her progress with her obesity treatment plan along with follow-up of her obesity related diagnoses. Donna Marquez is on the Category 3 Plan and states she is following her eating plan approximately 40% of the time. Donna Marquez states she is walking, strength and cardio 45 minutes 2-3 times per week.  Today's visit was #:13 Starting weight: 240 lbs Starting date: 05/23/2020 Today's weight: 203 lbs Today's date: 06/13/2021 Total lbs lost to date: 37 lbs Total lbs lost since last in-office visit: +2  Interim History: Donna Marquez has been off [place] for a few months. Her new employer doesn't cover AOMs. She had done well maintaining her weight until vacation a few weeks ago.  She has not had an office visit since 03/12/2021. She recently joined the [employer].  Subjective:   1. Prediabetes Donna Marquez's AC1 has been 5.6 in the past last year.  Lab Results  Component Value Date   HGBA1C 5.1 03/12/2021   Lab Results  Component Value Date   INSULIN 13.8 03/12/2021   INSULIN 16.6 09/26/2020   INSULIN 13.2 05/23/2020    2. Other depression with emotional eating Donna Marquez notes cravings has increased since she has been off Wegovy.  3. Vitamin D deficiency Donna Marquez's Vitamin D is low at 44.9. On weekly prescription Vitamin D.  Lab Results  Component Value Date   VD25OH 44.9 03/12/2021   VD25OH 48.0 09/26/2020   VD25OH 18.5 (L) 05/23/2020    4. At risk for side effect of medication Donna Marquez is at risk due to starting Ozempic.  Assessment/Plan:   1. Prediabetes Donna Marquez agreed to start Ozempic 0.50 mg if we can get approved by insurance.  2. Other depression with emotional eating   We will refill Wellbutrin SR for 1 month with no refills.  - buPROPion (WELLBUTRIN SR) 150 MG 12 hr tablet; Take 1 tablet (150 mg total) by mouth 2 (two) times daily.  Dispense: 60 tablet; Refill: 0 - Vitamin D, Ergocalciferol, (DRISDOL) 1.25 MG (50000 UNIT) CAPS capsule;  Take 1 capsule (50,000 Units total) by mouth every 7 (seven) days.  Dispense: 4 capsule; Refill: 0 - Semaglutide,0.25 or 0.5MG /DOS, (OZEMPIC, 0.25 OR 0.5 MG/DOSE,) 2 MG/1.5ML SOPN; Inject 0.5 mg into the skin once a week.  Dispense: 1.5 mL; Refill: 0  3. Vitamin D deficiency We will refill Vitamin D for 1 month with no refills and she agrees to continue to take prescription Vitamin D 50,000 IU every week and will follow-up for routine testing of Vitamin D, at least 2-3 times per year to avoid over-replacement.  - Vitamin D, Ergocalciferol, (DRISDOL) 1.25 MG (50000 UNIT) CAPS capsule; Take 1 capsule (50,000 Units total) by mouth every 7 (seven) days.  Dispense: 4 capsule; Refill: 0  4. At risk for side effect of medication Donna Marquez was given approximately 15 minutes of counseling today to help her avoid medication non-adherence.  We discussed importance of taking medications at a similar time each day and the use of daily pill organizers to help improve medication adherence.  Repetitive spaced learning was employed today to elicit superior memory formation and behavioral change.   5. Obesity: Current BMI 33.78 Donna Marquez is currently in the action stage of change. As such, her goal is to continue with weight loss efforts. She has agreed to the Category 3 Plan or following a lower carbohydrate, vegetable and lean protein rich diet plan.   Exercise goals:  As is.  Behavioral modification strategies: increasing lean protein  intake and decreasing simple carbohydrates.  Donna Marquez has agreed to follow-up with our clinic in 2-3 weeks.  Objective:   Blood pressure 96/62, pulse 80, temperature 97.9 F (36.6 C), height 5\' 5"  (1.651 m), weight 203 lb (92.1 kg), SpO2 99 %, not currently breastfeeding. Body mass index is 33.78 kg/m.  General: Cooperative, alert, well developed, in no acute distress. HEENT: Conjunctivae and lids unremarkable. Cardiovascular: Regular rhythm.  Lungs: Normal work of  breathing. Neurologic: No focal deficits.   Lab Results  Component Value Date   CREATININE 0.75 03/12/2021   BUN 9 03/12/2021   NA 139 03/12/2021   K 4.7 03/12/2021   CL 102 03/12/2021   CO2 24 03/12/2021   Lab Results  Component Value Date   ALT 11 03/12/2021   AST 18 03/12/2021   ALKPHOS 81 03/12/2021   BILITOT 0.4 03/12/2021   Lab Results  Component Value Date   HGBA1C 5.1 03/12/2021   HGBA1C 5.6 09/26/2020   HGBA1C 5.4 05/23/2020   Lab Results  Component Value Date   INSULIN 13.8 03/12/2021   INSULIN 16.6 09/26/2020   INSULIN 13.2 05/23/2020   Lab Results  Component Value Date   TSH 1.380 03/12/2021   Lab Results  Component Value Date   CHOL 145 09/26/2020   HDL 49 09/26/2020   LDLCALC 82 09/26/2020   TRIG 71 09/26/2020   CHOLHDL 2.9 05/23/2020   Lab Results  Component Value Date   VD25OH 44.9 03/12/2021   VD25OH 48.0 09/26/2020   VD25OH 18.5 (L) 05/23/2020   Lab Results  Component Value Date   WBC 4.9 03/12/2021   HGB 12.1 03/12/2021   HCT 39.2 03/12/2021   MCV 86 03/12/2021   PLT 322 03/12/2021   Lab Results  Component Value Date   IRON 58 03/12/2021   TIBC 361 03/12/2021   FERRITIN 16 03/12/2021   Attestation Statements:   Reviewed by clinician on day of visit: allergies, medications, problem list, medical history, surgical history, family history, social history, and previous encounter notes.  I, 05/12/2021, RMA, am acting as [place] for Energy manager, FNP.  I have reviewed the above documentation for accuracy and completeness, and I agree with the above. -  [place], FNP

## 2021-06-19 NOTE — Telephone Encounter (Signed)
Please advise 

## 2021-06-21 ENCOUNTER — Encounter (INDEPENDENT_AMBULATORY_CARE_PROVIDER_SITE_OTHER): Payer: Self-pay | Admitting: Family Medicine

## 2021-06-21 DIAGNOSIS — R7303 Prediabetes: Secondary | ICD-10-CM | POA: Insufficient documentation

## 2021-07-08 ENCOUNTER — Encounter (INDEPENDENT_AMBULATORY_CARE_PROVIDER_SITE_OTHER): Payer: Self-pay | Admitting: Family Medicine

## 2021-07-09 ENCOUNTER — Ambulatory Visit (INDEPENDENT_AMBULATORY_CARE_PROVIDER_SITE_OTHER): Payer: 59 | Admitting: Family Medicine

## 2021-07-29 ENCOUNTER — Encounter (INDEPENDENT_AMBULATORY_CARE_PROVIDER_SITE_OTHER): Payer: Self-pay | Admitting: Family Medicine

## 2022-07-09 ENCOUNTER — Encounter (INDEPENDENT_AMBULATORY_CARE_PROVIDER_SITE_OTHER): Payer: Self-pay

## 2022-09-05 DIAGNOSIS — F32A Depression, unspecified: Secondary | ICD-10-CM | POA: Diagnosis not present

## 2022-10-15 DIAGNOSIS — F411 Generalized anxiety disorder: Secondary | ICD-10-CM | POA: Diagnosis not present

## 2022-10-15 DIAGNOSIS — F439 Reaction to severe stress, unspecified: Secondary | ICD-10-CM | POA: Diagnosis not present

## 2022-10-29 DIAGNOSIS — F439 Reaction to severe stress, unspecified: Secondary | ICD-10-CM | POA: Diagnosis not present

## 2022-10-29 DIAGNOSIS — F411 Generalized anxiety disorder: Secondary | ICD-10-CM | POA: Diagnosis not present

## 2022-11-12 DIAGNOSIS — F411 Generalized anxiety disorder: Secondary | ICD-10-CM | POA: Diagnosis not present

## 2022-11-12 DIAGNOSIS — F439 Reaction to severe stress, unspecified: Secondary | ICD-10-CM | POA: Diagnosis not present

## 2022-11-17 DIAGNOSIS — J069 Acute upper respiratory infection, unspecified: Secondary | ICD-10-CM | POA: Diagnosis not present

## 2023-05-11 DIAGNOSIS — E559 Vitamin D deficiency, unspecified: Secondary | ICD-10-CM | POA: Diagnosis not present

## 2023-05-11 DIAGNOSIS — Z01419 Encounter for gynecological examination (general) (routine) without abnormal findings: Secondary | ICD-10-CM | POA: Diagnosis not present

## 2023-05-11 DIAGNOSIS — Z113 Encounter for screening for infections with a predominantly sexual mode of transmission: Secondary | ICD-10-CM | POA: Diagnosis not present

## 2023-05-11 DIAGNOSIS — Z1321 Encounter for screening for nutritional disorder: Secondary | ICD-10-CM | POA: Diagnosis not present

## 2023-05-11 DIAGNOSIS — Z13228 Encounter for screening for other metabolic disorders: Secondary | ICD-10-CM | POA: Diagnosis not present

## 2023-05-11 DIAGNOSIS — Z130 Encounter for screening for diseases of the blood and blood-forming organs and certain disorders involving the immune mechanism: Secondary | ICD-10-CM | POA: Diagnosis not present

## 2023-05-11 DIAGNOSIS — Z1389 Encounter for screening for other disorder: Secondary | ICD-10-CM | POA: Diagnosis not present

## 2023-05-11 DIAGNOSIS — Z124 Encounter for screening for malignant neoplasm of cervix: Secondary | ICD-10-CM | POA: Diagnosis not present

## 2023-05-11 DIAGNOSIS — Z13220 Encounter for screening for lipoid disorders: Secondary | ICD-10-CM | POA: Diagnosis not present

## 2023-05-25 DIAGNOSIS — N912 Amenorrhea, unspecified: Secondary | ICD-10-CM | POA: Diagnosis not present

## 2023-05-27 DIAGNOSIS — N3001 Acute cystitis with hematuria: Secondary | ICD-10-CM | POA: Diagnosis not present

## 2023-05-27 DIAGNOSIS — R110 Nausea: Secondary | ICD-10-CM | POA: Diagnosis not present

## 2023-05-27 DIAGNOSIS — R1031 Right lower quadrant pain: Secondary | ICD-10-CM | POA: Diagnosis not present

## 2023-05-27 DIAGNOSIS — N83201 Unspecified ovarian cyst, right side: Secondary | ICD-10-CM | POA: Diagnosis not present

## 2023-07-07 DIAGNOSIS — R102 Pelvic and perineal pain: Secondary | ICD-10-CM | POA: Diagnosis not present

## 2023-07-07 DIAGNOSIS — Z113 Encounter for screening for infections with a predominantly sexual mode of transmission: Secondary | ICD-10-CM | POA: Diagnosis not present

## 2023-07-07 DIAGNOSIS — N83201 Unspecified ovarian cyst, right side: Secondary | ICD-10-CM | POA: Diagnosis not present

## 2023-07-07 DIAGNOSIS — Z8742 Personal history of other diseases of the female genital tract: Secondary | ICD-10-CM | POA: Diagnosis not present

## 2023-08-06 DIAGNOSIS — R5383 Other fatigue: Secondary | ICD-10-CM | POA: Diagnosis not present

## 2023-08-06 DIAGNOSIS — R0683 Snoring: Secondary | ICD-10-CM | POA: Diagnosis not present

## 2023-08-06 DIAGNOSIS — R41840 Attention and concentration deficit: Secondary | ICD-10-CM | POA: Diagnosis not present
